# Patient Record
Sex: Female | Born: 1955 | Race: White | Hispanic: No | Marital: Married | Smoking: Never smoker
Health system: Southern US, Community
[De-identification: ages and names within clinical notes are randomized; demographics above are authoritative.]

## PROBLEM LIST (undated history)

## (undated) DIAGNOSIS — E039 Hypothyroidism, unspecified: Secondary | ICD-10-CM

## (undated) DIAGNOSIS — H269 Unspecified cataract: Secondary | ICD-10-CM

## (undated) DIAGNOSIS — M1990 Unspecified osteoarthritis, unspecified site: Secondary | ICD-10-CM

## (undated) DIAGNOSIS — M069 Rheumatoid arthritis, unspecified: Secondary | ICD-10-CM

## (undated) DIAGNOSIS — E785 Hyperlipidemia, unspecified: Secondary | ICD-10-CM

## (undated) DIAGNOSIS — F419 Anxiety disorder, unspecified: Secondary | ICD-10-CM

## (undated) DIAGNOSIS — K219 Gastro-esophageal reflux disease without esophagitis: Secondary | ICD-10-CM

## (undated) DIAGNOSIS — F32A Depression, unspecified: Secondary | ICD-10-CM

## (undated) DIAGNOSIS — G4730 Sleep apnea, unspecified: Secondary | ICD-10-CM

## (undated) DIAGNOSIS — I2510 Atherosclerotic heart disease of native coronary artery without angina pectoris: Secondary | ICD-10-CM

## (undated) DIAGNOSIS — C55 Malignant neoplasm of uterus, part unspecified: Secondary | ICD-10-CM

## (undated) DIAGNOSIS — I10 Essential (primary) hypertension: Secondary | ICD-10-CM

## (undated) DIAGNOSIS — I209 Angina pectoris, unspecified: Secondary | ICD-10-CM

## (undated) DIAGNOSIS — I499 Cardiac arrhythmia, unspecified: Secondary | ICD-10-CM

## (undated) DIAGNOSIS — J189 Pneumonia, unspecified organism: Secondary | ICD-10-CM

## (undated) DIAGNOSIS — F329 Major depressive disorder, single episode, unspecified: Secondary | ICD-10-CM

## (undated) DIAGNOSIS — J479 Bronchiectasis, uncomplicated: Secondary | ICD-10-CM

## (undated) DIAGNOSIS — D649 Anemia, unspecified: Secondary | ICD-10-CM

## (undated) DIAGNOSIS — H9190 Unspecified hearing loss, unspecified ear: Secondary | ICD-10-CM

## (undated) HISTORY — PX: ABDOMINAL HYSTERECTOMY: SHX81

## (undated) HISTORY — PX: COLONOSCOPY: SHX174

## (undated) HISTORY — DX: Unspecified osteoarthritis, unspecified site: M19.90

## (undated) HISTORY — DX: Atherosclerotic heart disease of native coronary artery without angina pectoris: I25.10

## (undated) HISTORY — DX: Hypothyroidism, unspecified: E03.9

## (undated) HISTORY — PX: TONSILLECTOMY AND ADENOIDECTOMY: SUR1326

## (undated) HISTORY — DX: Hyperlipidemia, unspecified: E78.5

## (undated) HISTORY — PX: CARDIAC CATHETERIZATION: SHX172

## (undated) HISTORY — PX: UPPER GASTROINTESTINAL ENDOSCOPY: SHX188

## (undated) HISTORY — PX: NO PAST SURGERIES: SHX2092

## (undated) HISTORY — DX: Anemia, unspecified: D64.9

## (undated) HISTORY — DX: Essential (primary) hypertension: I10

## (undated) HISTORY — PX: OTHER SURGICAL HISTORY: SHX169

## (undated) HISTORY — PX: BACK SURGERY: SHX140

## (undated) HISTORY — PX: MRI: SHX5353

## (undated) HISTORY — DX: Malignant neoplasm of uterus, part unspecified: C55

---
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
## 2004-04-23 DIAGNOSIS — I2510 Atherosclerotic heart disease of native coronary artery without angina pectoris: Secondary | ICD-10-CM

## 2004-04-23 HISTORY — PX: CORONARY STENT INTERVENTION: CATH118234

## 2004-04-23 HISTORY — PX: LEFT HEART CATH AND CORONARY ANGIOGRAPHY: CATH118249

## 2004-04-23 HISTORY — DX: Atherosclerotic heart disease of native coronary artery without angina pectoris: I25.10

## 2006-04-23 HISTORY — PX: TOTAL ABDOMINAL HYSTERECTOMY W/ BILATERAL SALPINGOOPHORECTOMY: SHX83

## 2009-06-10 ENCOUNTER — Encounter: Payer: Self-pay | Admitting: Cardiovascular Disease

## 2009-07-01 ENCOUNTER — Encounter: Payer: Self-pay | Admitting: Cardiovascular Disease

## 2010-01-23 ENCOUNTER — Encounter: Payer: Self-pay | Admitting: Cardiovascular Disease

## 2010-02-17 ENCOUNTER — Ambulatory Visit: Payer: Self-pay | Admitting: Cardiovascular Disease

## 2010-02-17 ENCOUNTER — Encounter: Payer: Self-pay | Admitting: Cardiovascular Disease

## 2010-02-17 DIAGNOSIS — I10 Essential (primary) hypertension: Secondary | ICD-10-CM | POA: Insufficient documentation

## 2010-02-17 DIAGNOSIS — E785 Hyperlipidemia, unspecified: Secondary | ICD-10-CM | POA: Insufficient documentation

## 2010-02-17 DIAGNOSIS — I2510 Atherosclerotic heart disease of native coronary artery without angina pectoris: Secondary | ICD-10-CM

## 2010-05-23 NOTE — Assessment & Plan Note (Signed)
Summary: NP6/CAD   Visit Type:  Initial Consult Primary Provider:  Dr Selena Batten  CC:  New evaluation per Dr. Selena Batten- CAD.  History of Present Illness: 55 year-old woman presents for initial evaluation of coronary artery disease.  In 2006 she developed leg swelling and shortness of breath. She noted progressive exercise intolerance secondary to chest tightness and dyspnea and ultimately was diagnosed with severe diagonal stenosis treated with PCI. She had immediate symptom relief after her coronary intervention.  Walks 2 miles per day without exertional symptoms. She specifically denies chest pain or tightness. No dyspnea, edema, orthopnea, or PND. She notes palpitations and increased heart rate without Toprol.    Current Medications (verified): 1)  Levothyroxine Sodium 100 Mcg Tabs (Levothyroxine Sodium) .... Take 1 Tablet By Mouth Once A Day 2)  Aspirin Ec 325 Mg Tbec (Aspirin) .... Take One Tablet By Mouth Daily 3)  Lipitor 80 Mg Tabs (Atorvastatin Calcium) .... Take One Tablet By Mouth Daily. 4)  Lisinopril 20 Mg Tabs (Lisinopril) .... Take One Tablet By Mouth Daily 5)  Toprol Xl 50 Mg Xr24h-Tab (Metoprolol Succinate) .... Take 1 Tablet By Mouth Once A Day 6)  Zetia 10 Mg Tabs (Ezetimibe) .... Take One Tablet By Mouth Daily.  Allergies (verified): No Known Drug Allergies  Past History:  Past medical history reviewed for relevance to current acute and chronic problems.  Past Medical History: CAD s/p PCI in 2006, treated with a bare metal stent (3.0x12 mm Driver placed in the diagonal branch) Hypertension Dyslipidemia Osteoarthritis - shoulder and hip Hypothyroidism  Past Surgical History: TAH-BSO 2008  Family History: Mother - diabetes and MI at 47, died at 59 Maternal grandmother died at 47 of cardiac disease  Social History: Works as a Charity fundraiser Recently moved to [place] from [place] Nonsmoker Social Etoh  Review of Systems       Positive for right shoulder and  left hip pain. Otherwise negative except as per HPI.   Vital Signs:  Patient profile:   55 year old female Height:      65 inches Weight:      162.75 pounds BMI:     27.18 Pulse rate:   59 / minute Resp:     18 per minute BP sitting:   120 / 64  (left arm) Cuff size:   large  Vitals Entered By: Vikki Ports (February 17, 2010 12:08 PM)  Physical Exam  General:  Pt is well-developed, alert and oriented, no acute distress HEENT: normal Neck: no thyromegaly           JVP normal, carotid upstrokes normal without bruits Lungs: CTA Chest: equal expansion  CV: Apical impulse nondisplaced, RRR without murmur or gallop Abd: soft, NT, positive BS, no HSM, no bruit Back: no CVA tenderness Ext: no clubbing, cyanosis, or edema        femoral pulses 2+ without bruits        pedal pulses 2+ and equal Skin: warm, dry, no rash Neuro: CNII-XII intact,strength 5/5 = b/l    EKG  Procedure date:  02/17/2010  Findings:      NSR 59 bpm within normal limits  Impression & Recommendations:  Problem # 1:  CORONARY ATHEROSCLEROSIS NATIVE CORONARY ARTERY (ICD-414.01) The patient's history was reviewed and it appears she was treated with single vessel bare metal stenting for Class 3 angina in 2006. She is going to obtain records from her cardiologist in [place] - she was given release of information forms today. She is  on an appropriate medical regimen and is having no angina with daily exercise. She has had followup stress testing and this has been normal by her report. Will plan on followup in 12 months, likely with a stress test at that time.  Her updated medication list for this problem includes:    Aspirin Ec 325 Mg Tbec (Aspirin) .Marland Kitchen... Take one tablet by mouth daily    Lisinopril 20 Mg Tabs (Lisinopril) .Marland Kitchen... Take one tablet by mouth daily    Toprol Xl 50 Mg Xr24h-tab (Metoprolol succinate) .Marland Kitchen... Take 1 tablet by mouth once a day  Orders: EKG w/ Interpretation (93000)  Problem # 2:   HYPERLIPIDEMIA-MIXED (ICD-272.4) Lipids from Dr Elmyra Ricks office reviewed from 01/23/2010: Total chol 152, Trig 114, HDL 53, and LDL 76. At goal.  Her updated medication list for this problem includes:    Lipitor 80 Mg Tabs (Atorvastatin calcium) .Marland Kitchen... Take one tablet by mouth daily.    Zetia 10 Mg Tabs (Ezetimibe) .Marland Kitchen... Take one tablet by mouth daily.  Problem # 3:  HYPERTENSION, BENIGN (ICD-401.1) Excellent control.  Her updated medication list for this problem includes:    Aspirin Ec 325 Mg Tbec (Aspirin) .Marland Kitchen... Take one tablet by mouth daily    Lisinopril 20 Mg Tabs (Lisinopril) .Marland Kitchen... Take one tablet by mouth daily    Toprol Xl 50 Mg Xr24h-tab (Metoprolol succinate) .Marland Kitchen... Take 1 tablet by mouth once a day  BP today: 120/64  Patient Instructions: 1)  Your physician recommends that you continue on your current medications as directed. Please refer to the Current Medication list given to you today. 2)  Your physician wants you to follow-up in: 1 YEAR.  You will receive a reminder letter in the mail two months in advance. If you don't receive a letter, please call our office to schedule the follow-up appointment.

## 2010-05-23 NOTE — Letter (Signed)
Summary: [facility] History and Physical Report    [facility] History and Physical Report    Imported By: Roderic Ovens 03/14/2010 14:40:01  _____________________________________________________________________  External Attachment:    Type:   Image     Comment:   External Document

## 2010-05-23 NOTE — Letter (Signed)
Summary: [facility] History and Physical Report    [facility] History and Physical Report    Imported By: Roderic Ovens 03/14/2010 14:39:34  _____________________________________________________________________  External Attachment:    Type:   Image     Comment:   External Document

## 2010-05-23 NOTE — Letter (Signed)
Summary: [facility] Medical Referral Scheduling   [facility] Referral Scheduling   Imported By: Roderic Ovens 03/14/2010 15:08:25  _____________________________________________________________________  External Attachment:    Type:   Image     Comment:   External Document

## 2010-11-21 ENCOUNTER — Telehealth: Payer: Self-pay | Admitting: Cardiovascular Disease

## 2010-11-21 NOTE — Telephone Encounter (Signed)
Spoke with pt who reports she has been having tightness in chest and abdominal distress off and on for last month.  Pt reports this is similar to symptoms she had prior to stent. Pt has been bothered by heat so has been taking it easy lately but notices she tires easily. Appointment scheduled for pt to see Tereso Newcomer, PA on November 24, 2010 at 8:30. I told her if symptoms were to worsen before then she should go to ED to be evaluated.

## 2010-11-21 NOTE — Telephone Encounter (Signed)
Pt called with tightness in center of chest with stomach distress/no swelling/ Having several weeks to one month.  Would like some test ordered for her.  Please call her at [phone]

## 2010-11-22 HISTORY — PX: LEFT HEART CATH AND CORONARY ANGIOGRAPHY: CATH118249

## 2010-11-23 ENCOUNTER — Encounter: Payer: Self-pay | Admitting: Physician Assistant

## 2010-11-24 ENCOUNTER — Telehealth: Payer: Self-pay | Admitting: Physician Assistant

## 2010-11-24 ENCOUNTER — Encounter: Payer: Self-pay | Admitting: Physician Assistant

## 2010-11-24 ENCOUNTER — Encounter: Payer: Self-pay | Admitting: *Deleted

## 2010-11-24 ENCOUNTER — Ambulatory Visit (INDEPENDENT_AMBULATORY_CARE_PROVIDER_SITE_OTHER): Payer: Self-pay | Admitting: Physician Assistant

## 2010-11-24 DIAGNOSIS — I2510 Atherosclerotic heart disease of native coronary artery without angina pectoris: Secondary | ICD-10-CM

## 2010-11-24 DIAGNOSIS — R079 Chest pain, unspecified: Secondary | ICD-10-CM

## 2010-11-24 DIAGNOSIS — E785 Hyperlipidemia, unspecified: Secondary | ICD-10-CM

## 2010-11-24 DIAGNOSIS — I10 Essential (primary) hypertension: Secondary | ICD-10-CM

## 2010-11-24 LAB — CBC WITH DIFFERENTIAL/PLATELET
Basophils Relative: 0.6 % (ref 0.0–3.0)
Eosinophils Absolute: 0.1 10*3/uL (ref 0.0–0.7)
Eosinophils Relative: 1.9 % (ref 0.0–5.0)
HCT: 38.3 % (ref 36.0–46.0)
Hemoglobin: 12.8 g/dL (ref 12.0–15.0)
MCHC: 33.5 g/dL (ref 30.0–36.0)
MCV: 91.6 fl (ref 78.0–100.0)
Monocytes Absolute: 0.4 10*3/uL (ref 0.1–1.0)
Neutro Abs: 4.6 10*3/uL (ref 1.4–7.7)
RBC: 4.18 Mil/uL (ref 3.87–5.11)
WBC: 6.8 10*3/uL (ref 4.5–10.5)

## 2010-11-24 LAB — PROTIME-INR: INR: 1 ratio (ref 0.8–1.0)

## 2010-11-24 LAB — BASIC METABOLIC PANEL
CO2: 27 mEq/L (ref 19–32)
Calcium: 9.3 mg/dL (ref 8.4–10.5)
Chloride: 108 mEq/L (ref 96–112)
Creatinine, Ser: 0.7 mg/dL (ref 0.4–1.2)
Glucose, Bld: 99 mg/dL (ref 70–99)

## 2010-11-24 MED ORDER — ISOSORBIDE MONONITRATE ER 30 MG PO TB24: ORAL_TABLET | ORAL | Status: DC

## 2010-11-24 MED ORDER — NITROGLYCERIN 0.4 MG SL SUBL: 0.4000 mg | SUBLINGUAL_TABLET | SUBLINGUAL | Status: DC | PRN

## 2010-11-24 NOTE — Patient Instructions (Signed)
Your physician has requested that you have a cardiac catheterization. Cardiac catheterization is used to diagnose and/or treat various heart conditions. Doctors may recommend this procedure for a number of different reasons. The most common reason is to evaluate chest pain. Chest pain can be a symptom of coronary artery disease (CAD), and cardiac catheterization can show whether plaque is narrowing or blocking your heart's arteries. This procedure is also used to evaluate the valves, as well as measure the blood flow and oxygen levels in different parts of your heart. For further information please visit [email or web address] Please follow instruction sheet, as given. Scheduled for November 27, 2010.  Your physician has recommended you make the following change in your medication:  Start Imdur 15 mg by mouth daily. Use SL NTG as needed for chest pain.

## 2010-11-24 NOTE — Progress Notes (Signed)
History of Present Illness: Primary Cardiologist: Cindy [name] is a 55 y.o. female presents for evaluation of chest pain.  She established with Dr. Excell Seltzer in 01/2010 for CAD.  In 2006 she developed leg swelling and shortness of breath. She noted progressive exercise intolerance secondary to chest tightness and dyspnea and ultimately was diagnosed with severe diagonal stenosis treated with PCI. She had immediate symptom relief after her coronary intervention.  This was performed in [place].    She notes epigastric and lower chest discomfort over the last 6-8 weeks.  This is similar to her previous angina.  It feels heavy.  It is fairly constant.  She's nauseated most of the time.  There is no association with meals.  She denies dysphagia.  She does have some slight dyspnea with exertion.  She has radiation to her back when she exerts herself.  She denies any radiation to her jaw or arms.  She denies syncope.  She's had lightheadedness.  She denies orthopnea, PND.  She has not noted any edema.  Of note, she underwent stress testing prior to her cardiac catheterization in 2006.  Her symptoms currently are not as severe as they were in 2006  Past Medical History  Diagnosis Date  . Coronary artery disease     s/p PCI in 2006, treated with bare metal stent (3.0x22mm Driver placed in diagonal branch)  . Hypertension   . Dyslipidemia   . Osteoarthritis     Shoulder and hip  . Hypothyroidism     Current Outpatient Prescriptions  Medication Sig Dispense Refill  . aspirin EC 325 MG tablet Take 325 mg by mouth daily.        Marland Kitchen atorvastatin (LIPITOR) 80 MG tablet Take 80 mg by mouth daily.        Marland Kitchen ezetimibe (ZETIA) 10 MG tablet Take 10 mg by mouth daily.        Marland Kitchen levothyroxine (SYNTHROID, LEVOTHROID) 100 MCG tablet Take 100 mcg by mouth daily.        Marland Kitchen lisinopril (PRINIVIL,ZESTRIL) 20 MG tablet Take 20 mg by mouth daily.        . metoprolol (TOPROL-XL) 50 MG 24 hr tablet Take 50 mg  by mouth daily.          Allergies: No Known Allergies  Social history:  Nonsmoker  Family history:  Significant for premature CAD in her mother  ROS:  Please see the history of present illness.  She reports a nonproductive cough.  All other systems reviewed and negative.   Vital Signs: Ht 5\' 5"  (1.651 m)  Wt 162 lb (73.483 kg)  BMI 26.96 kg/m2  PHYSICAL EXAM: Well nourished, well developed, in no acute distress HEENT: normal Neck: no JVD Endocrine: No thyromegaly Vascular: No carotid bruits Cardiac:  normal S1, S2; RRR; no murmur Lungs:  clear to auscultation bilaterally, no wheezing, rhonchi or rales Abd: soft, nontender, no hepatomegaly Ext: no edema Skin: warm and dry Neuro:  CNs 2-12 intact, no focal abnormalities noted Psych: Normal affect  EKG:  Sinus bradycardia, rate 56, normal axis, nonspecific ST-T changes, no significant change compared to prior tracings.  ASSESSMENT AND PLAN:

## 2010-11-24 NOTE — Assessment & Plan Note (Signed)
Controlled.  Continue current therapy.  

## 2010-11-24 NOTE — Assessment & Plan Note (Signed)
Her symptoms are very similar to her previous angina.  Currently, her symptoms are stable.  Her EKG is unchanged.  She has not had an assessment since her PCI in 2006.  I recommend proceeding with cardiac catheterization.  I discussed this with Dr. Myrtis Ser, the DOD.  He also agrees.  I will place the patient on isosorbide 15 mg a day.  She will also be given a prescription for p.r.n. Nitroglycerin.  Cardiac catheterization will be arranged early next week.  If her symptoms should change or worsen over the next several days, she knows to go to emergency room.  Risks and benefits of cardiac catheterization have been discussed with the patient.  These include bleeding, infection, kidney damage, stroke, heart attack, death.  The patient understands these risks and is willing to proceed.

## 2010-11-24 NOTE — Telephone Encounter (Addendum)
Due to [facility] being closed for lunch, I left a message with the operator requesting the cath and intravenous records for this patient asap.

## 2010-11-24 NOTE — Assessment & Plan Note (Signed)
Continue ASA and statin.  Proceed with cardiac cath as noted.

## 2010-11-24 NOTE — Assessment & Plan Note (Addendum)
Managed by PCP

## 2010-11-24 NOTE — Telephone Encounter (Signed)
Faxed ROI (provided by [facility]) to Dr. Lowella Fairy at [facility] in [place], [place] ([phone])

## 2010-11-27 ENCOUNTER — Inpatient Hospital Stay (HOSPITAL_BASED_OUTPATIENT_CLINIC_OR_DEPARTMENT_OTHER)
Admission: RE | Admit: 2010-11-27 | Discharge: 2010-11-27 | Disposition: A | Discharge status: Home / Self Care | Payer: Managed Care, Other (non HMO) | Source: Ambulatory Visit | Attending: Cardiovascular Disease | Admitting: Cardiovascular Disease

## 2010-11-27 DIAGNOSIS — R079 Chest pain, unspecified: Secondary | ICD-10-CM | POA: Insufficient documentation

## 2010-11-27 DIAGNOSIS — I2510 Atherosclerotic heart disease of native coronary artery without angina pectoris: Secondary | ICD-10-CM | POA: Insufficient documentation

## 2010-11-27 DIAGNOSIS — E039 Hypothyroidism, unspecified: Secondary | ICD-10-CM | POA: Insufficient documentation

## 2010-11-27 DIAGNOSIS — Z9861 Coronary angioplasty status: Secondary | ICD-10-CM | POA: Insufficient documentation

## 2010-11-27 DIAGNOSIS — E785 Hyperlipidemia, unspecified: Secondary | ICD-10-CM | POA: Insufficient documentation

## 2010-11-27 DIAGNOSIS — I10 Essential (primary) hypertension: Secondary | ICD-10-CM | POA: Insufficient documentation

## 2010-11-28 NOTE — Cardiovascular Report (Signed)
NAME:  [name], Cindy                ACCOUNT NO.:  [id number]  MEDICAL RECORD NO.:  [id number]  LOCATION:                                 FACILITY:  PHYSICIAN:  Verne Carrow, MDDATE OF BIRTH:  October 02, 1955  DATE OF PROCEDURE:  11/27/2010 DATE OF DISCHARGE:                           CARDIAC CATHETERIZATION   PRIMARY CARDIOLOGIST:  Veverly Fells. Excell Seltzer, MD  PROCEDURES PERFORMED: 1. Left heart catheterization. 2. Selective coronary angiography. 3. Left ventricular angiogram.  OPERATOR:  Verne Carrow, MD  INDICATIONS:  This is a 55 year old Caucasian female with a history of hypertension, hyperlipidemia, hypothyroidism, and coronary artery disease who underwent placement of a bare-metal stent in the diagonal branch of her left anterior descending artery in 2006 in [place]. The patient has recently had progressive exercise intolerance with chest discomfort and shortness of breath.  She also has some nausea.  She was seen in the office by Tereso Newcomer and recommendations were made for diagnostic cardiac catheterization today.  DETAILS OF PROCEDURE IN DETAIL:  The patient was brought to the Outpatient Cardiac Catheterization Laboratory after signing informed consent for the procedure.  The right groin was prepped and draped in a sterile fashion.  1% lidocaine was used for local anesthesia.  A 4- French sheath was inserted into the right femoral artery without difficulty.  Standard diagnostic catheters were used to perform selective coronary angiography.  A pigtail catheter was used to perform left ventricular angiogram.  The patient tolerated the diagnostic portion of the procedure well.  She was taken to the recovery area in stable condition.  HEMODYNAMIC FINDINGS:  Central aortic pressure 133/70.  Left ventricular pressure 133/13/21.  ANGIOGRAPHIC FINDINGS: 1. Left main coronary artery had no evidence of disease. 2. Left anterior descending was a large vessel  that coursed to the     apex and gave off a moderate-sized diagonal branch.  The left     anterior descending artery had mild luminal irregularities in the     proximal mid segment, but no focally obstructive lesions.  The     diagonal branch was a moderate-sized vessel with a patent stent in     the midportion of this vessel.  There appeared to be approximately     20% diffuse in-stent restenosis, but this did not appear to be flow     limiting. 3. The circumflex artery gave rise to an early obtuse marginal branch     followed by a moderate-sized second obtuse marginal branch.  All     these vessels were patent, but no obstructive disease. 4. The right coronary artery is a large dominant vessel with no     evidence of disease. 5. Left ventricular angiogram was performed in the RAO projection and     showed normal left ventricular systolic function with ejection     fraction of 65%.  IMPRESSION: 1. Stable single-vessel coronary artery disease. 2. Normal left ventricular function.  RECOMMENDATIONS:  I would recommend continued medical management at this time.  The patient has been started on Imdur in the office and has felt better since she started this medication.  There is a possibility that she does have small vessel  disease with a good response to the Imdur. She will follow up with Dr. Excell Seltzer in several weeks.     Verne Carrow, MD     CM/MEDQ  D:  11/27/2010  T:  11/27/2010  Job:  [id number]  cc:   Tereso Newcomer, PA-C  Electronically Signed by Verne Carrow MD on 11/28/2010 01:38:59 PM

## 2010-12-14 ENCOUNTER — Encounter: Payer: Managed Care, Other (non HMO) | Admitting: Physician Assistant

## 2010-12-19 ENCOUNTER — Ambulatory Visit (INDEPENDENT_AMBULATORY_CARE_PROVIDER_SITE_OTHER): Payer: Managed Care, Other (non HMO) | Admitting: Physician Assistant

## 2010-12-19 ENCOUNTER — Encounter: Payer: Self-pay | Admitting: Physician Assistant

## 2010-12-19 VITALS — BP 118/64 | HR 71 | Resp 12 | Ht 65.0 in | Wt 164.0 lb

## 2010-12-19 DIAGNOSIS — R079 Chest pain, unspecified: Secondary | ICD-10-CM

## 2010-12-19 DIAGNOSIS — I2510 Atherosclerotic heart disease of native coronary artery without angina pectoris: Secondary | ICD-10-CM

## 2010-12-19 DIAGNOSIS — I10 Essential (primary) hypertension: Secondary | ICD-10-CM

## 2010-12-19 DIAGNOSIS — E785 Hyperlipidemia, unspecified: Secondary | ICD-10-CM

## 2010-12-19 MED ORDER — ISOSORBIDE MONONITRATE ER 30 MG PO TB24: ORAL_TABLET | ORAL | Status: DC

## 2010-12-19 NOTE — Assessment & Plan Note (Signed)
As above.  Continue ASA and statin.  Follow up with Dr. Excell Seltzer in 6 weeks.

## 2010-12-19 NOTE — Assessment & Plan Note (Signed)
Controlled.  Continue current therapy.  

## 2010-12-19 NOTE — Progress Notes (Signed)
History of Present Illness: Primary Cardiologist: [name] is a 55 y.o. female who presents for post hospital follow up.  She established with Dr. Excell [name] in 01/2010 for CAD.  In 2006 she developed leg swelling and shortness of breath. She noted progressive exercise intolerance secondary to chest tightness and dyspnea and ultimately was diagnosed with severe diagonal stenosis treated with PCI.  She had immediate symptom relief after her coronary intervention.   This was performed in [place].   I saw her a few weeks ago with epigastric and lower chest discomfort that was similar to her previous angina.  I arranged for her to have a cardiac catheterization.  This was performed 8/6 by Dr. Clifton [name].  It demonstrated a patent stent in the mid diagonal with 20% in-stent restenosis, otherwise no CAD and an EF of 65%.  She noted improvement with initiation of isosorbide.  This was continued post catheterization due the possibility that she may have small vessel disease.  She has only been taking the Imdur PRN.  She will get recurrent symptoms every several days.  She notes associated dyspnea.  She may have some association with meals.  No dysphagia, melena or weight loss.  Symptoms generally come on with exertion.  No syncope.  No orthopnea, PND or edema.    Past Medical History  Diagnosis Date  . Coronary artery disease     s/p PCI in 2006, treated with bare metal stent (3.0x39mm Driver placed in diagonal branch);  cath 8/12: mDx stent ok with 20% ISR, EF 65%  . Hypertension   . Dyslipidemia   . Osteoarthritis     Shoulder and hip  . Hypothyroidism     Current Outpatient Prescriptions  Medication Sig Dispense Refill  . aspirin EC 325 MG tablet Take 325 mg by mouth daily.        Marland Kitchen atorvastatin (LIPITOR) 80 MG tablet Take 80 mg by mouth daily.        Marland Kitchen ezetimibe (ZETIA) 10 MG tablet Take 10 mg by mouth daily.        . isosorbide mononitrate (IMDUR) 30 MG 24 hr tablet Take half  tablet by mouth daily  15 tablet  11  . levothyroxine (SYNTHROID, LEVOTHROID) 100 MCG tablet Take 100 mcg by mouth daily.        Marland Kitchen lisinopril (PRINIVIL,ZESTRIL) 20 MG tablet Take 20 mg by mouth daily.        . metoprolol (TOPROL-XL) 50 MG 24 hr tablet Take 50 mg by mouth daily.        . nitroGLYCERIN (NITROSTAT) 0.4 MG SL tablet Place 1 tablet (0.4 mg total) under the tongue every 5 (five) minutes as needed for chest pain.  25 tablet  6  . DISCONTD: isosorbide mononitrate (IMDUR) 30 MG 24 hr tablet Take half tablet by mouth daily  15 tablet  11    Allergies: No Known Allergies  Social history:  Nonsmoker  Vital Signs: BP 118/64  Pulse 71  Resp 12  Ht 5\' 5"  (1.651 m)  Wt 164 lb (74.39 kg)  BMI 27.29 kg/m2  PHYSICAL EXAM: Well nourished, well developed, in no acute distress HEENT: normal Neck: no JVD Cardiac:  normal S1, S2; RRR; no murmur Lungs:  clear to auscultation bilaterally, no wheezing, rhonchi or rales Abd: soft, nontender, no hepatomegaly Ext: no edema; RFA site without hematoma or bruit Skin: warm and dry Neuro:  CNs 2-12 intact, no focal abnormalities noted Psych: Normal affect  EKG:  Normal sinus rhythm, heart rate 64, normal axis, poor R-wave progression, nonspecific ST-T wave changes  ASSESSMENT AND PLAN:

## 2010-12-19 NOTE — Assessment & Plan Note (Signed)
Managed by PCP.  Goal LDL less than 70.  

## 2010-12-19 NOTE — Patient Instructions (Signed)
Your physician recommends that you schedule a follow-up appointment in: 01/30/11 @ 2:15 to see Dr. Excell Seltzer  Your physician has recommended you make the following change in your medication: START TAKING YOUR IMDUR 30 MG TABLET 1/2 (HALF) TABLET EVERY DAY AS PER SCOTT WEAVER, PA-C.

## 2010-12-19 NOTE — Assessment & Plan Note (Signed)
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  I have asked her to take her Imdur every day to prevent symptoms.  I have also asked her to increase her activity.  If she has further symptoms, she may need to try taking a PPI and seeing GI.  However, at this point, I do not suspect GI etiology.

## 2011-01-30 ENCOUNTER — Ambulatory Visit (INDEPENDENT_AMBULATORY_CARE_PROVIDER_SITE_OTHER): Payer: Managed Care, Other (non HMO) | Admitting: Cardiovascular Disease

## 2011-01-30 ENCOUNTER — Encounter: Payer: Self-pay | Admitting: Cardiovascular Disease

## 2011-01-30 DIAGNOSIS — I2510 Atherosclerotic heart disease of native coronary artery without angina pectoris: Secondary | ICD-10-CM

## 2011-01-30 DIAGNOSIS — I10 Essential (primary) hypertension: Secondary | ICD-10-CM

## 2011-01-30 MED ORDER — EZETIMIBE 10 MG PO TABS: 10.0000 mg | ORAL_TABLET | Freq: Every day | ORAL | Status: AC

## 2011-01-30 MED ORDER — ISOSORBIDE MONONITRATE ER 30 MG PO TB24: ORAL_TABLET | ORAL | Status: DC

## 2011-01-30 MED ORDER — LISINOPRIL 20 MG PO TABS: 20.0000 mg | ORAL_TABLET | Freq: Every day | ORAL | Status: DC

## 2011-01-30 MED ORDER — METOPROLOL SUCCINATE ER 50 MG PO TB24: 50.0000 mg | ORAL_TABLET | Freq: Every day | ORAL | Status: DC

## 2011-01-30 MED ORDER — ATORVASTATIN CALCIUM 80 MG PO TABS
80.0000 mg | ORAL_TABLET | Freq: Every day | ORAL | Status: DC
Start: 2011-01-30 — End: 2015-02-22

## 2011-01-30 NOTE — Patient Instructions (Signed)
Your physician recommends that you continue on your current medications as directed. Please refer to the Current Medication list given to you today.   Your physician wants you to follow-up in:1 YEAR  You will receive a reminder letter in the mail two months in advance. If you don't receive a letter, please call our office to schedule the follow-up appointment.  

## 2011-01-30 NOTE — Progress Notes (Signed)
HPI:  This is a 55 year old woman presenting for followup. She has a history of coronary artery disease and underwent stenting of the diagonal branch back in 2006. Bare-metal stent platform was utilized. She presented with recurrent chest pain in August 2012 and underwent diagnostic catheterization by Dr. Clifton James. This demonstrated no significant stenosis of the left circumflex and right coronary arteries. There was minimal nonobstructive disease in the LAD with a patent stent in the diagonal with only mild in-stent restenosis. The patient's left ventricular function is normal.  She does have angina at times of high stress and with walking up hills. Her chest pain symptoms are improved after starting Imdur. She has minimal symptoms with her day-to-day routine. She denies exertional dyspnea, edema, or palpitations.  Outpatient Encounter Prescriptions as of 01/30/2011  Medication Sig Dispense Refill  . aspirin EC 325 MG tablet Take 325 mg by mouth daily.        Marland Kitchen atorvastatin (LIPITOR) 80 MG tablet Take 80 mg by mouth daily.        Marland Kitchen ezetimibe (ZETIA) 10 MG tablet Take 10 mg by mouth daily.        . isosorbide mononitrate (IMDUR) 30 MG 24 hr tablet Take half tablet by mouth daily  15 tablet  11  . levothyroxine (SYNTHROID, LEVOTHROID) 100 MCG tablet Take 100 mcg by mouth daily.        Marland Kitchen lisinopril (PRINIVIL,ZESTRIL) 20 MG tablet Take 20 mg by mouth daily.        . metoprolol (TOPROL-XL) 50 MG 24 hr tablet Take 50 mg by mouth daily.        . nitroGLYCERIN (NITROSTAT) 0.4 MG SL tablet Place 1 tablet (0.4 mg total) under the tongue every 5 (five) minutes as needed for chest pain.  25 tablet  6    No Known Allergies  Past Medical History  Diagnosis Date  . Coronary artery disease     s/p PCI in 2006, treated with bare metal stent (3.0x78mm Driver placed in diagonal branch);  cath 8/12: mDx stent ok with 20% ISR, EF 65%  . Hypertension   . Dyslipidemia   . Osteoarthritis     Shoulder and hip  .  Hypothyroidism     ROS: Negative except as per HPI  BP 130/68  Pulse 64  Resp 18  Ht 5\' 5"  (1.651 m)  Wt 161 lb 12.8 oz (73.392 kg)  BMI 26.92 kg/m2  PHYSICAL EXAM: Pt is alert and oriented, NAD HEENT: normal Neck: JVP - normal, carotids 2+= without bruits Lungs: CTA bilaterally CV: RRR without murmur or gallop Abd: soft, NT Ext: no C/C/E, distal pulses intact and equal Skin: warm/dry no rash  ASSESSMENT AND PLAN:

## 2011-02-04 ENCOUNTER — Encounter: Payer: Self-pay | Admitting: Cardiovascular Disease

## 2011-02-04 NOTE — Assessment & Plan Note (Signed)
Blood pressure is controlled on current agents.

## 2011-02-04 NOTE — Assessment & Plan Note (Signed)
The patient is stable with class 1-2 angina. She has done well with the addition of Imdur. Her recent catheterization results were reviewed and they are reassuring. She should continue with her current risk reduction measures which include aspirin, lipid-lowering therapy, and ACE inhibitor and beta blocker, and regular exercise.

## 2012-01-30 ENCOUNTER — Ambulatory Visit: Payer: Managed Care, Other (non HMO) | Admitting: Cardiovascular Disease

## 2012-02-14 ENCOUNTER — Ambulatory Visit: Payer: Managed Care, Other (non HMO) | Admitting: Cardiovascular Disease

## 2012-03-14 ENCOUNTER — Encounter: Payer: Self-pay | Admitting: Cardiovascular Disease

## 2012-03-28 ENCOUNTER — Ambulatory Visit (INDEPENDENT_AMBULATORY_CARE_PROVIDER_SITE_OTHER): Payer: BC Managed Care – PPO | Admitting: Cardiovascular Disease

## 2012-03-28 ENCOUNTER — Encounter: Payer: Self-pay | Admitting: Cardiovascular Disease

## 2012-03-28 VITALS — BP 122/78 | HR 62 | Ht 66.0 in | Wt 160.0 lb

## 2012-03-28 DIAGNOSIS — I2510 Atherosclerotic heart disease of native coronary artery without angina pectoris: Secondary | ICD-10-CM

## 2012-03-28 MED ORDER — RANOLAZINE ER 500 MG PO TB12: 500.0000 mg | ORAL_TABLET | Freq: Two times a day (BID) | ORAL | Status: DC

## 2012-03-28 NOTE — Progress Notes (Signed)
HPI:  56 year old woman presenting for followup evaluation. The patient has a history of coronary artery disease and underwent stenting of a diagonal branch and 2006. She underwent cardiac catheterization most recently in August 2012 when she presented with chest pain. She was found to have minor nonobstructive disease with continued patency of her diagonal stent and normal left ventricular systolic function was noted.  The patient continues to have anginal symptoms with exertion. She denies resting symptoms, but does experience chest and epigastric discomfort with exertion and sometimes with emotional stress. This has responded well isosorbide but she does not like to take this every day. She only takes it as needed and has complete relief within one hour. She admits to associated shortness of breath. Her symptoms have not changed over quite some time. She denies edema, palpitations, orthopnea, or PND.  Outpatient Encounter Prescriptions as of 03/28/2012  Medication Sig Dispense Refill  . aspirin EC 325 MG tablet Take 325 mg by mouth daily.        Marland Kitchen atorvastatin (LIPITOR) 80 MG tablet Take 1 tablet (80 mg total) by mouth daily.  90 tablet  3  . ezetimibe (ZETIA) 10 MG tablet Take 1 tablet (10 mg total) by mouth daily.  90 tablet  3  . isosorbide mononitrate (IMDUR) 30 MG 24 hr tablet Take half tablet by mouth daily  90 tablet  3  . levothyroxine (SYNTHROID, LEVOTHROID) 100 MCG tablet Take 100 mcg by mouth daily.        Marland Kitchen lisinopril (PRINIVIL,ZESTRIL) 20 MG tablet Take 1 tablet (20 mg total) by mouth daily.  90 tablet  3  . metoprolol (TOPROL-XL) 50 MG 24 hr tablet Take 1 tablet (50 mg total) by mouth daily.  90 tablet  3  . nitroGLYCERIN (NITROSTAT) 0.4 MG SL tablet Place 1 tablet (0.4 mg total) under the tongue every 5 (five) minutes as needed for chest pain.  25 tablet  6    No Known Allergies  Past Medical History  Diagnosis Date  . Coronary artery disease     s/p PCI in 2006, treated  with bare metal stent (3.0x64mm Driver placed in diagonal branch);  cath 8/12: mDx stent ok with 20% ISR, EF 65%  . Hypertension   . Dyslipidemia   . Osteoarthritis     Shoulder and hip  . Hypothyroidism    ROS: Negative except as per HPI  BP 122/78  Pulse 62  Ht 5\' 6"  (1.676 m)  Wt 72.576 kg (160 lb)  BMI 25.82 kg/m2  SpO2 98%  PHYSICAL EXAM: Pt is alert and oriented, NAD HEENT: normal Neck: JVP - normal, carotids 2+= without bruits Lungs: CTA bilaterally CV: RRR without murmur or gallop Abd: soft, NT, Positive BS, no hepatomegaly Ext: no C/C/E, distal pulses intact and equal Skin: warm/dry no rash  EKG:  Normal sinus rhythm 62 beats per minute, within normal limits.  ASSESSMENT AND PLAN: 1. Coronary artery disease, native vessel with CCS class II angina. 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 We discussed potential treatment options including daily isosorbide versus a trial of Ranexa. She is willing to consider Ranexa and we have written her a prescription for 500 mg twice daily and also provided her with samples. She understands that if she has breakthrough angina she can still take isosorbide as needed. She otherwise will continue her current medical program.  2. Hypertension. Well controlled on current  medical therapy  3. Hyperlipidemia. She is managed by Dr. Selena Batten. She is on a combination of ezetimibe and atorvastatin.  Tonny Bollman 03/28/2012 5:11 PM

## 2012-03-28 NOTE — Patient Instructions (Addendum)
Your physician wants you to follow-up in:  12 months.  You will receive a reminder letter in the mail two months in advance. If you don't receive a letter, please call our office to schedule the follow-up appointment.  Your physician has recommended you make the following change in your medication: Start Ranexa 500 mg by mouth twice daily

## 2012-06-19 ENCOUNTER — Ambulatory Visit
Admission: RE | Admit: 2012-06-19 | Discharge: 2012-06-19 | Disposition: A | Discharge status: Home / Self Care | Payer: BC Managed Care – PPO | Source: Ambulatory Visit | Attending: Internal Medicine | Admitting: Internal Medicine

## 2012-06-19 ENCOUNTER — Other Ambulatory Visit: Payer: BC Managed Care – PPO

## 2012-06-19 ENCOUNTER — Other Ambulatory Visit: Payer: Self-pay | Admitting: Internal Medicine

## 2012-06-19 DIAGNOSIS — R519 Headache, unspecified: Secondary | ICD-10-CM

## 2012-10-22 ENCOUNTER — Encounter: Payer: Self-pay | Admitting: Gastroenterology

## 2012-10-30 ENCOUNTER — Encounter: Payer: Self-pay | Admitting: Gastroenterology

## 2012-10-30 ENCOUNTER — Ambulatory Visit (INDEPENDENT_AMBULATORY_CARE_PROVIDER_SITE_OTHER): Payer: BC Managed Care – PPO | Admitting: Gastroenterology

## 2012-10-30 VITALS — BP 104/60 | HR 64 | Ht 65.5 in | Wt 164.4 lb

## 2012-10-30 DIAGNOSIS — R1013 Epigastric pain: Secondary | ICD-10-CM

## 2012-10-30 DIAGNOSIS — Z1211 Encounter for screening for malignant neoplasm of colon: Secondary | ICD-10-CM

## 2012-10-30 MED ORDER — ESOMEPRAZOLE MAGNESIUM 40 MG PO CPDR: 40.0000 mg | DELAYED_RELEASE_CAPSULE | Freq: Two times a day (BID) | ORAL | Status: DC

## 2012-10-30 NOTE — Progress Notes (Addendum)
History of Present Illness: This is a 57 year old female who relates a 6 week history of constant epigastric pain exacerbated by meals. In addition she has had an intermittent cough. Zantac, TUMS, Gas-X and Prilosec have provided minimal relief. She states she previously underwent colonoscopy and upper endoscopy in [place] around 2006 for evaluation of anemia. She states her colonoscopy was normal in her upper endoscopy showed signs of acid reflux and she was prescribed Nexium although she did not recall having reflux symptoms and generally has not had any upper GI symptoms until recently. Denies weight loss, constipation, diarrhea, change in stool caliber, melena, hematochezia, nausea, vomiting, dysphagia, chest pain.  Review of Systems: Pertinent positive and negative review of systems were noted in the above HPI section. All other review of systems were otherwise negative.  Current Medications, Allergies, Past Medical History, Past Surgical History, Family History and Social History were reviewed in [employer] record.  Physical Exam: General: Well developed , well nourished, no acute distress Head: Normocephalic and atraumatic Eyes:  sclerae anicteric, EOMI Ears: Normal auditory acuity Mouth: No deformity or lesions Neck: Supple, no masses or thyromegaly Lungs: Clear throughout to auscultation Heart: Regular rate and rhythm; no murmurs, rubs or bruits Abdomen: Soft, non tender and non distended. No masses, hepatosplenomegaly or hernias noted. Normal Bowel sounds Musculoskeletal: Symmetrical with no gross deformities  Skin: No lesions on visible extremities Pulses:  Normal pulses noted Extremities: No clubbing, cyanosis, edema or deformities noted Neurological: Alert oriented x 4, grossly nonfocal Cervical Nodes:  No significant cervical adenopathy Inguinal Nodes: No significant inguinal adenopathy Psychological:  Alert and cooperative. Normal mood and  affect  Assessment and Recommendations:  1. Epigastric pain. Rule out GERD, esophagitis, ulcer, gastritis. DC omeprazole. Begin Nexium 40 mg twice a day and standard antireflux measures. Schedule upper endoscopy. The risks, benefits, and alternatives to endoscopy with possible biopsy and possible dilation were discussed with the patient and they consent to proceed.   2. Colorectal cancer screening, average risk. Screening colonoscopy in June 2016. Attempt to obtain prior colonoscopy and upper endoscopy records and will adjust colonoscopy screening date accordingly if we obtain records.   11/13/12 records from [place] received and reviewed. 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 EGD in 03/2005: erosive gastritis. Biopsy: chronic gastritis, H. pylori negative,

## 2012-10-30 NOTE — Patient Instructions (Addendum)
Start Nexium samples one tablet by mouth twice daily your Upper Endoscopy.   You have been scheduled for an endoscopy with propofol. Please follow written instructions given to you at your visit today. If you use inhalers (even only as needed), please bring them with you on the day of your procedure. Your physician has requested that you go to www.startemmi.com and enter the access code given to you at your visit today. This web site gives a general overview about your procedure. However, you should still follow specific instructions given to you by our office regarding your preparation for the procedure.  Patient advised to avoid spicy, acidic, citrus, chocolate, mints, fruit and fruit juices.  Limit the intake of caffeine, alcohol and Soda.  Don't exercise too soon after eating.  Don't lie down within 3-4 hours of eating.  Elevate the head of your bed.  Thank you for choosing me and [facility].  Venita Lick. Pleas Koch., MD., Clementeen Graham  cc: Pearson Grippe, MD

## 2012-11-06 ENCOUNTER — Telehealth: Payer: Self-pay | Admitting: Gastroenterology

## 2012-11-06 NOTE — Telephone Encounter (Signed)
Rec'd from [facility] forward 3 pages to Dr.Stark

## 2012-11-14 ENCOUNTER — Ambulatory Visit (AMBULATORY_SURGERY_CENTER): Payer: BC Managed Care – PPO | Admitting: Gastroenterology

## 2012-11-14 ENCOUNTER — Encounter: Payer: Self-pay | Admitting: Gastroenterology

## 2012-11-14 VITALS — BP 139/75 | HR 68 | Temp 98.8°F | Resp 19 | Ht 65.5 in | Wt 164.4 lb

## 2012-11-14 DIAGNOSIS — R1013 Epigastric pain: Secondary | ICD-10-CM

## 2012-11-14 MED ORDER — SODIUM CHLORIDE 0.9 % IV SOLN: 500.0000 mL | INTRAVENOUS | Status: DC

## 2012-11-14 NOTE — Op Note (Signed)
[facility] [address] [place], [address]   ENDOSCOPY PROCEDURE REPORT  PATIENT: Cindy [name], Cindy [name]  MR#: [id number] BIRTHDATE: 1956-04-09 , 57  yrs. old GENDER: Female ENDOSCOPIST: Meryl Dare, MD, [facility] REFERRED BY: PROCEDURE DATE:  11/14/2012 PROCEDURE:  EGD, diagnostic ASA CLASS:     Class II INDICATIONS:  Epigastric pain. MEDICATIONS: MAC sedation, administered by CRNA and propofol (Diprivan) 150mg  IV TOPICAL ANESTHETIC: Cetacaine Spray  DESCRIPTION OF PROCEDURE: After the risks benefits and alternatives of the procedure were thoroughly explained, informed consent was obtained.  The LB [id number] endoscope was introduced through the mouth and advanced to the second portion of the duodenum. Without limitations.  The instrument was slowly withdrawn as the mucosa was fully examined.    ESOPHAGUS: The mucosa of the esophagus appeared normal. STOMACH: The mucosa and folds of the stomach appeared normal. DUODENUM: The duodenal mucosa showed no abnormalities in the bulb and second portion of the duodenum.  Retroflexed views revealed no abnormalities.     The scope was then withdrawn from the patient and the procedure completed.  COMPLICATIONS: There were no complications.  ENDOSCOPIC IMPRESSION: 1.   The EGD appeared normal  RECOMMENDATIONS: 1.  Anti-reflux regimen 2.  Continue PPI 3.  Abdominal Ultrasound 4.  Office appt in 1 month   eSigned:  Meryl Dare, MD, [facility] 11/14/2012 3:45 PM

## 2012-11-14 NOTE — Progress Notes (Signed)
No egg or soy allergy. emw 

## 2012-11-14 NOTE — Progress Notes (Signed)
Patient did not experience any of the following events: a burn prior to discharge; a fall within the facility; wrong site/side/patient/procedure/implant event; or a hospital transfer or hospital admission upon discharge from the facility. (G8907) Patient did not have preoperative order for IV antibiotic SSI prophylaxis. (G8918)  

## 2012-11-14 NOTE — Progress Notes (Signed)
rEPORT TO PACU RN, VSS, BBS=CLEAR

## 2012-11-14 NOTE — Patient Instructions (Addendum)
YOU HAD AN ENDOSCOPIC PROCEDURE TODAY AT THE [facility]: Refer to the procedure report that was given to you for any specific questions about what was found during the examination.  If the procedure report does not answer your questions, please call your gastroenterologist to clarify.  If you requested that your care partner not be given the details of your procedure findings, then the procedure report has been included in a sealed envelope for you to review at your convenience later.  YOU SHOULD EXPECT: Some feelings of bloating in the abdomen. Passage of more gas than usual.  Walking can help get rid of the air that was put into your GI tract during the procedure and reduce the bloating. If you had a lower endoscopy (such as a colonoscopy or flexible sigmoidoscopy) you may notice spotting of blood in your stool or on the toilet paper. If you underwent a bowel prep for your procedure, then you may not have a normal bowel movement for a few days.  DIET: Your first meal following the procedure should be a light meal and then it is ok to progress to your normal diet.  A half-sandwich or bowl of soup is an example of a good first meal.  Heavy or fried foods are harder to digest and may make you feel nauseous or bloated.  Likewise meals heavy in dairy and vegetables can cause extra gas to form and this can also increase the bloating.  Drink plenty of fluids but you should avoid alcoholic beverages for 24 hours.  ACTIVITY: Your care partner should take you home directly after the procedure.  You should plan to take it easy, moving slowly for the rest of the day.  You can resume normal activity the day after the procedure however you should NOT DRIVE or use heavy machinery for 24 hours (because of the sedation medicines used during the test).    SYMPTOMS TO REPORT IMMEDIATELY: A gastroenterologist can be reached at any hour.  During normal business hours, 8:30 AM to 5:00 PM Monday through Friday,  call [phone].  After hours and on weekends, please call the GI answering service at [phone] who will take a message and have the physician on call contact you.    Following upper endoscopy (EGD)  Vomiting of blood or coffee ground material  New chest pain or pain under the shoulder blades  Painful or persistently difficult swallowing  New shortness of breath  Fever of 100F or higher  Black, tarry-looking stools  FOLLOW UP: If any biopsies were taken you will be contacted by phone or by letter within the next 1-3 weeks.  Call your gastroenterologist if you have not heard about the biopsies in 3 weeks.  Our staff will call the home number listed on your records the next business day following your procedure to check on you and address any questions or concerns that you may have at that time regarding the information given to you following your procedure. This is a courtesy call and so if there is no answer at the home number and we have not heard from you through the emergency physician on call, we will assume that you have returned to your regular daily activities without incident.  SIGNATURES/CONFIDENTIALITY: You and/or your care partner have signed paperwork which will be entered into your electronic medical record.  These signatures attest to the fact that that the information above on your After Visit Summary has been reviewed and is understood.  Full   responsibility of the confidentiality of this discharge information lies with you and/or your care-partner.  Resume medications. Call office to schedule follow up appointment for 1 month. Office staff will notify you of ultra sound appointment.

## 2012-11-17 ENCOUNTER — Other Ambulatory Visit: Payer: Self-pay

## 2012-11-17 ENCOUNTER — Telehealth: Payer: Self-pay | Admitting: Cardiovascular Disease

## 2012-11-17 ENCOUNTER — Ambulatory Visit (INDEPENDENT_AMBULATORY_CARE_PROVIDER_SITE_OTHER): Payer: BC Managed Care – PPO | Admitting: Physician Assistant

## 2012-11-17 ENCOUNTER — Telehealth: Payer: Self-pay | Admitting: *Deleted

## 2012-11-17 ENCOUNTER — Encounter: Payer: Self-pay | Admitting: Physician Assistant

## 2012-11-17 ENCOUNTER — Encounter: Payer: Self-pay | Admitting: Cardiovascular Disease

## 2012-11-17 VITALS — BP 126/78 | HR 64 | Ht 65.0 in | Wt 168.0 lb

## 2012-11-17 DIAGNOSIS — R1013 Epigastric pain: Secondary | ICD-10-CM

## 2012-11-17 DIAGNOSIS — I2510 Atherosclerotic heart disease of native coronary artery without angina pectoris: Secondary | ICD-10-CM

## 2012-11-17 DIAGNOSIS — I10 Essential (primary) hypertension: Secondary | ICD-10-CM

## 2012-11-17 DIAGNOSIS — E785 Hyperlipidemia, unspecified: Secondary | ICD-10-CM

## 2012-11-17 DIAGNOSIS — R079 Chest pain, unspecified: Secondary | ICD-10-CM

## 2012-11-17 NOTE — Telephone Encounter (Signed)
  Follow up Call-  Call back number 11/14/2012  Post procedure Call Back phone  # [phone]  Permission to leave phone message Yes     Patient questions:  Message left to call us if necessary.

## 2012-11-17 NOTE — Telephone Encounter (Signed)
New Prob     Pts husband would like to speak to nurse regarding pts recent visit. Please call.

## 2012-11-17 NOTE — Telephone Encounter (Signed)
Pt was seen by Tereso Newcomer PA today. Pt was told that Dr. Excell Seltzer  was going to come to speak with pt today after seen the PA. Pt is scheduled for a Myoview on 11/25/12. Pt would like to see Dr. Excell Seltzer for  the test results.

## 2012-11-17 NOTE — Patient Instructions (Addendum)
PLEASE SCHEDULE AN EXERCISE MYOVIEW  FOLLOW UP WITH DR. Excell Seltzer IN 03/2013  NO MEDICATION CHANGES WERE MADE TODAY

## 2012-11-17 NOTE — Progress Notes (Signed)
[address] [place], [place]  [address] Phone: [phone] Fax:  [phone]  Date:  11/17/2012   ID:  Cindy [name], DOB Jan 22, 1956, MRN [id number]  PCP:  Pearson Grippe, MD  Cardiologist:  Dr. Tonny Bollman     History of Present Illness: Cindy [name] is a 57 y.o. female who returns for evaluation of chest and epigastric pain and dyspnea.  She has a hx of CAD, s/p stenting of the diagonal branch in 2006, HTN, HL, hypothyroidism. Last LHC 11/2010: Patent Dx stent with 20% ISR, EF 65%. 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 Last seen by Dr. Excell Seltzer 03/2012. Patient was placed on Ranexa at that time.  She had relief of angina with this regimen.  She did have an increase in her symptoms over the last few mos related to stress from her job.  She finally decided to quit her job and her symptoms improved.  Over the last several weeks, she has had fairly constant epigastric and chest pain.  She does note some worsening with eating.  She did see GI.  EGD done last week was normal.  Abdominal US is pending.   She tells me that she is concerned that her EGD was normal and she continues to have these symptoms.  She feels like her current symptoms are somewhat reminiscent of her prior angina before her PCI.  She notes some dyspnea with more extreme activities.  She is NYHA Class II.  No orthopnea, PND, edema.  No syncope or near syncope.     Wt Readings from Last 3 Encounters:  11/17/12 168 lb (76.204 kg)  11/14/12 164 lb 6.4 oz (74.571 kg)  10/30/12 164 lb 6.4 oz (74.571 kg)     Past Medical History  Diagnosis Date  . Coronary artery disease     s/p PCI in 2006, treated with bare metal stent (3.0x55mm Driver placed in diagonal branch);  cath 8/12: mDx stent ok with 20% ISR, EF 65%  . Hypertension   . Dyslipidemia   . Osteoarthritis     Shoulder and hip  . Hypothyroidism   . Arthritis   . Uterine cancer   . Anemia   .  Hyperlipidemia     Current Outpatient Prescriptions  Medication Sig Dispense Refill  . aspirin EC 325 MG tablet Take 325 mg by mouth daily.        Marland Kitchen atorvastatin (LIPITOR) 80 MG tablet Take 1 tablet (80 mg total) by mouth daily.  90 tablet  3  . esomeprazole (NEXIUM) 40 MG capsule Take 1 capsule (40 mg total) by mouth 2 (two) times daily.  50 capsule  0  . ezetimibe (ZETIA) 10 MG tablet Take 1 tablet (10 mg total) by mouth daily.  90 tablet  3  . isosorbide mononitrate (IMDUR) 30 MG 24 hr tablet Take half tablet by mouth as needed      . levothyroxine (SYNTHROID, LEVOTHROID) 100 MCG tablet Take 100 mcg by mouth daily.        Marland Kitchen lisinopril (PRINIVIL,ZESTRIL) 20 MG tablet Take 1 tablet (20 mg total) by mouth daily.  90 tablet  3  . metoprolol (TOPROL-XL) 50 MG 24 hr tablet Take 1 tablet (50 mg total) by mouth daily.  90 tablet  3  . nitroGLYCERIN (NITROSTAT) 0.4 MG SL tablet Place 1 tablet (0.4 mg total) under the tongue every 5 (five) minutes as needed for chest pain.  25 tablet  6  .  ranolazine (RANEXA) 500 MG 12 hr tablet Take 1 tablet (500 mg total) by mouth 2 (two) times daily.  60 tablet  11  . topiramate (TOPAMAX) 25 MG tablet Take 25 mg by mouth daily.        No current facility-administered medications for this visit.    Allergies:   No Known Allergies  Social History:  The patient  reports that she has never smoked. She has never used smokeless tobacco. She reports that  drinks alcohol. She reports that she does not use illicit drugs.   ROS:  Please see the history of present illness.   She notes dysphagia.  No nausea, vomiting, melena, hematochezia, weight loss.  She is frequently constipated.   All other systems reviewed and negative.   PHYSICAL EXAM: VS:  BP 126/78  Pulse 64  Ht 5\' 5"  (1.651 m)  Wt 168 lb (76.204 kg)  BMI 27.96 kg/m2 Well nourished, well developed, in no acute distress HEENT: normal Neck: no JVD Cardiac:  normal S1, S2; RRR; no murmur Lungs:  clear to  auscultation bilaterally, no wheezing, rhonchi or rales Abd: soft, nontender, no hepatomegaly Ext: no edema Skin: warm and dry Neuro:  CNs 2-12 intact, no focal abnormalities noted  EKG:  NSR, HR 64, normal axis, no acute changes     ASSESSMENT AND PLAN:  1. Chest Pain:  Symptoms sound more GI in nature than cardiac. She is concerned and she did have atypical symptoms prior to her PCI.  She also has microvascular angina that seems to be well controlled on Ranexa.  Will proceed with ETT-Myoview.  If low risk, she should continue to follow up with GI for other causes of her symptoms. 2. CAD:  Proceed with myoview as noted.  Continue ASA, statin. 3. Hypertension:  Controlled.  Continue current therapy.  4. Hyperlipidemia:  Continue statin. 5. Disposition:  F/u with Dr. Tonny Bollman in 03/2013 as planned or sooner if myoview high risk.   Signed, Tereso Newcomer, PA-C  11/17/2012 12:51 PM

## 2012-11-21 ENCOUNTER — Other Ambulatory Visit (HOSPITAL_COMMUNITY): Payer: BC Managed Care – PPO

## 2012-11-24 ENCOUNTER — Ambulatory Visit (HOSPITAL_COMMUNITY)
Admission: RE | Admit: 2012-11-24 | Discharge: 2012-11-24 | Disposition: A | Discharge status: Home / Self Care | Payer: BC Managed Care – PPO | Source: Ambulatory Visit | Attending: Gastroenterology | Admitting: Gastroenterology

## 2012-11-24 DIAGNOSIS — R109 Unspecified abdominal pain: Secondary | ICD-10-CM | POA: Insufficient documentation

## 2012-11-24 DIAGNOSIS — R1013 Epigastric pain: Secondary | ICD-10-CM

## 2012-11-25 ENCOUNTER — Ambulatory Visit (HOSPITAL_COMMUNITY): Payer: BC Managed Care – PPO | Attending: Cardiology | Admitting: Radiology

## 2012-11-25 VITALS — BP 138/67 | Ht 65.0 in | Wt 165.0 lb

## 2012-11-25 DIAGNOSIS — R079 Chest pain, unspecified: Secondary | ICD-10-CM

## 2012-11-25 DIAGNOSIS — R0602 Shortness of breath: Secondary | ICD-10-CM

## 2012-11-25 DIAGNOSIS — R0609 Other forms of dyspnea: Secondary | ICD-10-CM | POA: Insufficient documentation

## 2012-11-25 DIAGNOSIS — Z8249 Family history of ischemic heart disease and other diseases of the circulatory system: Secondary | ICD-10-CM | POA: Insufficient documentation

## 2012-11-25 DIAGNOSIS — I10 Essential (primary) hypertension: Secondary | ICD-10-CM | POA: Insufficient documentation

## 2012-11-25 DIAGNOSIS — I2510 Atherosclerotic heart disease of native coronary artery without angina pectoris: Secondary | ICD-10-CM

## 2012-11-25 DIAGNOSIS — R0989 Other specified symptoms and signs involving the circulatory and respiratory systems: Secondary | ICD-10-CM | POA: Insufficient documentation

## 2012-11-25 DIAGNOSIS — Z9861 Coronary angioplasty status: Secondary | ICD-10-CM | POA: Insufficient documentation

## 2012-11-25 MED ORDER — TECHNETIUM TC 99M SESTAMIBI GENERIC - CARDIOLITE
30.0000 | Freq: Once | INTRAVENOUS | Status: AC | PRN
  Administered 2012-11-25: 30 via INTRAVENOUS

## 2012-11-25 MED ORDER — TECHNETIUM TC 99M SESTAMIBI GENERIC - CARDIOLITE
10.0000 | Freq: Once | INTRAVENOUS | Status: AC | PRN
  Administered 2012-11-25: 10 via INTRAVENOUS

## 2012-11-25 NOTE — Progress Notes (Signed)
  [facility] SITE 3 NUCLEAR MED [address] [place], [place] [address] [phone]    Cardiology Nuclear Med Cindy [name] is a 57 y.o. female     MRN : [id number]     DOB: November 23, 1955  Procedure Date: 11/25/2012  Nuclear Med Background Indication for Stress Test:  Evaluation for Ischemia and Stent Patency History:  '06 Stents LAD in [place], '08 MPS: after Stent NL per pt in [place] 2012  Heart Cath: EF: 65% Stent  20% instent restenosis  Cardiac Risk Factors: Family History - CAD, Hypertension and Lipids  Symptoms:  Chest Pain, DOE and SOB   Nuclear Pre-Procedure Caffeine/Decaff Intake:  None NPO After: 8:00pm   Lungs:  clear O2 Sat: 97% on room air. IV 0.9% NS with Angio Cath:  22g  IV Site: R Hand  IV Started by:  Cathlyn Parsons, RN  Chest Size (in):  36 Cup Size: C  Height: 5\' 5"  (1.651 m)  Weight:  165 lb (74.844 kg)  BMI:  Body mass index is 27.46 kg/(m^2). Tech Comments:  No Toprol taken in 36 hrs    Nuclear Med Study 1 or 2 day study: 1 day  Stress Test Type:  Stress  Reading MD: Marca Ancona, MD  Order Authorizing Provider:  Casimiro Needle Cooper,MD  Resting Radionuclide: Technetium 37m Sestamibi  Resting Radionuclide Dose: 11.0 mCi   Stress Radionuclide:  Technetium 79m Sestamibi  Stress Radionuclide Dose: 33.0 mCi           Stress Protocol Rest HR: 60 Stress HR: 169  Rest BP: 138/67 Stress BP: 166/84  Exercise Time (min): 7:11 METS: 8.80   Predicted Max HR: 164 bpm % Max HR: 103.05 bpm Rate Pressure Product: 84132   Dose of Adenosine (mg):  n/a Dose of Lexiscan: n/a mg  Dose of Atropine (mg): n/a Dose of Dobutamine: n/a mcg/kg/min (at max HR)  Stress Test Technologist: Milana Na, EMT-P  Nuclear Technologist:  Harlow Asa, CNMT     Rest Procedure:  Myocardial perfusion imaging was performed at rest 45 minutes following the intravenous administration of Technetium 41m Sestamibi. Rest ECG: NSR - Normal EKG  Stress Procedure:  The  patient exercised on the treadmill utilizing the Bruce Protocol for 7:11 minutes. The patient stopped due to sob and denied any chest pain.  Technetium 56m Sestamibi was injected at peak exercise and myocardial perfusion imaging was performed after a brief delay. Stress ECG: Insignificant upsloping ST segment depression.  QPS Raw Data Images:  Normal; no motion artifact; normal heart/lung ratio. Stress Images:  Small, mild mid anterior perfusion defect. Rest Images:  Small, mild mid anterior perfusion defect.  Subtraction (SDS):  Fixed small mild mid anterior perfusion defect.  Transient Ischemic Dilatation (Normal <1.22):  n/a Lung/Heart Ratio (Normal <0.45):  0.31  Quantitative Gated Spect Images QGS EDV:  58ml QGS ESV:  12 ml  Impression Exercise Capacity:  Fair exercise capacity. BP Response:  Normal blood pressure response. Clinical Symptoms:  Dyspnea, no chest pain.  ECG Impression:  Insignificant upsloping ST segment depression. Comparison with Prior Nuclear Study: No images to compare  Overall Impression:  Low risk stress nuclear study with a fixed small mild mid anterior perfusion defect.  Given normal wall motion, I suspect that this represents diaphragmatic attenuation.  No ischemia. .  LV Ejection Fraction: 80%.  LV Wall Motion:  NL LV Function; NL Wall Motion  Marca Ancona 11/25/2012

## 2012-11-25 NOTE — Telephone Encounter (Signed)
Myoview was low risk, no significant ischemia.

## 2012-11-26 ENCOUNTER — Encounter: Payer: Self-pay | Admitting: Physician Assistant

## 2012-11-26 NOTE — Telephone Encounter (Signed)
Dr Excell Seltzer called the pt and left a message for her to contact the office about stress test results.  He will speak with the pt.

## 2012-12-02 ENCOUNTER — Encounter: Payer: BC Managed Care – PPO | Admitting: Gastroenterology

## 2012-12-04 NOTE — Telephone Encounter (Signed)
Left message on machine for pt to contact the office. If the pt is still having symptoms then she should be scheduled for follow-up with Dr Excell Seltzer.

## 2012-12-16 ENCOUNTER — Encounter: Payer: Self-pay | Admitting: Gastroenterology

## 2012-12-16 ENCOUNTER — Ambulatory Visit (INDEPENDENT_AMBULATORY_CARE_PROVIDER_SITE_OTHER): Payer: BC Managed Care – PPO | Admitting: Gastroenterology

## 2012-12-16 VITALS — BP 128/82 | HR 68 | Ht 65.0 in | Wt 168.0 lb

## 2012-12-16 DIAGNOSIS — R1013 Epigastric pain: Secondary | ICD-10-CM

## 2012-12-16 MED ORDER — ESOMEPRAZOLE MAGNESIUM 40 MG PO CPDR: 40.0000 mg | DELAYED_RELEASE_CAPSULE | Freq: Every day | ORAL | Status: DC

## 2012-12-16 NOTE — Patient Instructions (Addendum)
We have sent the following medications to your pharmacy for you to pick up at your convenience: Nexium 40 mg one tablet by mouth once daily.  Continue follow up with Dr. Selena Batten regarding your blood sugar levels.   Thank you for choosing me and [facility].  Venita Lick. Pleas Koch., MD., Clementeen Graham  cc: Pearson Grippe, MD

## 2012-12-16 NOTE — Progress Notes (Signed)
History of Present Illness: This is a 57 year old female seen initially with lower chest pain, epigastric pain and nausea. Upper endoscopy and abdominal ultrasound were unremarkable. She was placed on Nexium 40 mg twice daily. Her symptoms have significantly improved although she does still note mild pain and nausea. She states that she's had elevated blood sugars that has been monitored by Dr. Selena Batten.  Current Medications, Allergies, Past Medical History, Past Surgical History, Family History and Social History were reviewed in [employer] record.  Physical Exam: General: Well developed , well nourished, no acute distress Head: Normocephalic and atraumatic Eyes:  sclerae anicteric, EOMI Ears: Normal auditory acuity Mouth: No deformity or lesions Lungs: Clear throughout to auscultation Heart: Regular rate and rhythm; no murmurs, rubs or bruits Abdomen: Soft, non tender and non distended. No masses, hepatosplenomegaly or hernias noted. Normal Bowel sounds Musculoskeletal: Symmetrical with no gross deformities  Pulses:  Normal pulses noted Extremities: No clubbing, cyanosis, edema or deformities noted Neurological: Alert oriented x 4, grossly nonfocal Psychological:  Alert and cooperative. Anxious.  Assessment and Recommendations:  1. Epigastric, chest pain. EGD and [place] negative. Symptoms improved but no clear GI cause has been uncovered. Possible GERD symptoms atypical and not fully responsive to aggressive therapy. Return to Dr. Selena Batten to address hyperglycemia as this may be leading to dyspeptic symptoms. Change Nexium to 40 mg daily. Consider GES and CCK-HIDA if symptoms do not resolve.

## 2013-02-26 ENCOUNTER — Other Ambulatory Visit: Payer: Self-pay

## 2013-05-25 ENCOUNTER — Telehealth: Payer: Self-pay

## 2013-05-25 NOTE — Telephone Encounter (Signed)
Received fax stating patients insurance company no longer covers the Nexium and the preferred PPI's are Prilosec, Protonix, Dexilant, or Acipehx. Left a message for patient to call me back.

## 2013-05-26 NOTE — Telephone Encounter (Signed)
Left a message for patient to return my call. 

## 2013-05-28 NOTE — Telephone Encounter (Signed)
Left a message for patient to return my call. 

## 2014-03-16 ENCOUNTER — Other Ambulatory Visit: Payer: Self-pay | Admitting: Orthopedic Surgery

## 2014-03-16 DIAGNOSIS — M545 Low back pain, unspecified: Secondary | ICD-10-CM

## 2014-03-29 ENCOUNTER — Other Ambulatory Visit: Payer: BC Managed Care – PPO

## 2014-03-31 ENCOUNTER — Ambulatory Visit
Admission: RE | Admit: 2014-03-31 | Discharge: 2014-03-31 | Disposition: A | Discharge status: Home / Self Care | Payer: 59 | Source: Ambulatory Visit | Attending: Orthopedic Surgery | Admitting: Orthopedic Surgery

## 2014-03-31 DIAGNOSIS — M545 Low back pain, unspecified: Secondary | ICD-10-CM

## 2014-12-02 ENCOUNTER — Other Ambulatory Visit: Payer: Self-pay | Admitting: Neurosurgery

## 2015-02-11 ENCOUNTER — Encounter (HOSPITAL_COMMUNITY)
Admission: RE | Admit: 2015-02-11 | Discharge: 2015-02-11 | Disposition: A | Discharge status: Home / Self Care | Payer: 59 | Source: Ambulatory Visit | Attending: Neurosurgery | Admitting: Neurosurgery

## 2015-02-11 ENCOUNTER — Encounter (HOSPITAL_COMMUNITY): Payer: Self-pay

## 2015-02-11 ENCOUNTER — Other Ambulatory Visit: Payer: Self-pay

## 2015-02-11 DIAGNOSIS — F329 Major depressive disorder, single episode, unspecified: Secondary | ICD-10-CM | POA: Insufficient documentation

## 2015-02-11 DIAGNOSIS — E785 Hyperlipidemia, unspecified: Secondary | ICD-10-CM | POA: Diagnosis not present

## 2015-02-11 DIAGNOSIS — E039 Hypothyroidism, unspecified: Secondary | ICD-10-CM | POA: Insufficient documentation

## 2015-02-11 DIAGNOSIS — K219 Gastro-esophageal reflux disease without esophagitis: Secondary | ICD-10-CM | POA: Insufficient documentation

## 2015-02-11 DIAGNOSIS — Z01818 Encounter for other preprocedural examination: Secondary | ICD-10-CM | POA: Insufficient documentation

## 2015-02-11 DIAGNOSIS — F419 Anxiety disorder, unspecified: Secondary | ICD-10-CM | POA: Insufficient documentation

## 2015-02-11 DIAGNOSIS — Z01812 Encounter for preprocedural laboratory examination: Secondary | ICD-10-CM | POA: Diagnosis not present

## 2015-02-11 DIAGNOSIS — I2510 Atherosclerotic heart disease of native coronary artery without angina pectoris: Secondary | ICD-10-CM | POA: Diagnosis not present

## 2015-02-11 DIAGNOSIS — M4310 Spondylolisthesis, site unspecified: Secondary | ICD-10-CM | POA: Diagnosis not present

## 2015-02-11 DIAGNOSIS — Z955 Presence of coronary angioplasty implant and graft: Secondary | ICD-10-CM | POA: Diagnosis not present

## 2015-02-11 DIAGNOSIS — Z79899 Other long term (current) drug therapy: Secondary | ICD-10-CM | POA: Diagnosis not present

## 2015-02-11 HISTORY — DX: Gastro-esophageal reflux disease without esophagitis: K21.9

## 2015-02-11 HISTORY — DX: Angina pectoris, unspecified: I20.9

## 2015-02-11 HISTORY — DX: Major depressive disorder, single episode, unspecified: F32.9

## 2015-02-11 HISTORY — DX: Cardiac arrhythmia, unspecified: I49.9

## 2015-02-11 HISTORY — DX: Depression, unspecified: F32.A

## 2015-02-11 HISTORY — DX: Pneumonia, unspecified organism: J18.9

## 2015-02-11 HISTORY — DX: Anxiety disorder, unspecified: F41.9

## 2015-02-11 LAB — BASIC METABOLIC PANEL
Anion gap: 9 (ref 5–15)
BUN: 12 mg/dL (ref 6–20)
CHLORIDE: 107 mmol/L (ref 101–111)
CO2: 25 mmol/L (ref 22–32)
CREATININE: 0.75 mg/dL (ref 0.44–1.00)
Calcium: 9.4 mg/dL (ref 8.9–10.3)
GFR calc non Af Amer: >60 mL/min (ref >60)
Glucose, Bld: 95 mg/dL (ref 65–99)
Potassium: 3.9 mmol/L (ref 3.5–5.1)
Sodium: 141 mmol/L (ref 135–145)

## 2015-02-11 LAB — CBC
HCT: 38.5 % (ref 36.0–46.0)
HEMOGLOBIN: 12.6 g/dL (ref 12.0–15.0)
MCH: 29.7 pg (ref 26.0–34.0)
MCHC: 32.7 g/dL (ref 30.0–36.0)
MCV: 90.8 fL (ref 78.0–100.0)
PLATELETS: 250 10*3/uL (ref 150–400)
RBC: 4.24 MIL/uL (ref 3.87–5.11)
RDW: 13.3 % (ref 11.5–15.5)
WBC: 8.9 10*3/uL (ref 4.0–10.5)

## 2015-02-11 LAB — SURGICAL PCR SCREEN
MRSA, PCR: NEGATIVE
Staphylococcus aureus: NEGATIVE

## 2015-02-11 LAB — NO BLOOD PRODUCTS

## 2015-02-11 NOTE — Progress Notes (Signed)
PCP is Dr Jani Gravel Cardiologist is Dr Sherren Mocha States her last echo was 2006 Stress test noted in epic form 11-25-12 Pt reports her lase cath was in 2012 here at [facility]. Cindy [name] refuses blood products- refusal faxed to lab.- No type and screen was done due to pt refuses.

## 2015-02-11 NOTE — Pre-Procedure Instructions (Addendum)
[facility]  02/11/2015      [id number] - [place], [place] [address] [place] [address] Phone: [phone] Fax: [phone]  [facility] [place], [place] [address] [place] [address] Phone: [phone] Fax: [phone]    Your procedure is scheduled on   Report to [place] at 630 A.M.  Call this number if you have problems the morning of surgery:  [phone]   Remember:  Do not eat food or drink liquids after midnight.  Take these medicines the morning of surgery with A SIP OF WATER esomeprazole (Nexium), levothyroxine (Synthroid), Metoprolol (Toprol-XL), Nitroglycerin if needed, Sertraline (Zoloft)  Stop taking aspirin, ibuprofen, BC's, Goody's,herbal medications, Fish Oil,   Do not wear jewelry, make-up or nail polish.  Do not wear lotions, powders, or perfumes.  You may wear deodorant.  Do not shave 48 hours prior to surgery.  Men may shave face and neck.  Do not bring valuables to the hospital.  [facility] is not responsible for any belongings or valuables.  Contacts, dentures or bridgework may not be worn into surgery.  Leave your suitcase in the car.  After surgery it may be brought to your room.  For patients admitted to the hospital, discharge time will be determined by your treatment team.  Patients discharged the day of surgery will not be allowed to drive home.    Special instructions:  [facility] - Preparing for Surgery  Before surgery, you can play an important role.  Because skin is not sterile, your skin needs to be as free of germs as possible.  You can reduce the number of germs on you skin by washing with CHG (chlorahexidine gluconate) soap before surgery.  CHG is an antiseptic cleaner which kills germs and bonds with the skin to continue killing germs even after washing.  Please DO NOT use  if you have an allergy to CHG or antibacterial soaps.  If your skin becomes reddened/irritated stop using the CHG and inform your nurse when you arrive at Short Stay.  Do not shave (including legs and underarms) for at least 48 hours prior to the first CHG shower.  You may shave your face.  Please follow these instructions carefully:   1.  Shower with CHG Soap the night before surgery and the morning of Surgery.  2.  If you choose to wash your hair, wash your hair first as usual with your normal shampoo.  3.  After you shampoo, rinse your hair and body thoroughly to remove the  Shampoo.  4.  Use CHG as you would any other liquid soap.  You can apply chg directly to the skin and wash gently with scrungie or a clean washcloth.  5.  Apply the CHG Soap to your body ONLY FROM THE NECK DOWN.        Do not use on open wounds or open sores.  Avoid contact with your eyes,  ears, mouth and genitals (private parts).  Wash genitals (private parts)  with your normal soap.  6.  Wash thoroughly, paying special attention to the area where your surgery will be performed.  7.  Thoroughly rinse your body with warm water from the neck down.  8.  DO NOT shower/wash with your normal soap after using and rinsing off the CHG Soap.  9.  Pat yourself dry with a clean towel.  10.  Wear clean pajamas.            11.  Place clean sheets on your bed the night of your first shower and do not sleep with pets.  Day of Surgery  Do not apply any lotions/deoderants the morning of surgery.  Please wear clean clothes to the hospital/surgery center.     Please read over the following fact sheets that you were given. Pain Booklet, Coughing and Deep Breathing, Blood Transfusion Information, MRSA Information and Surgical Site Infection Prevention

## 2015-02-14 ENCOUNTER — Encounter (HOSPITAL_COMMUNITY): Payer: Self-pay

## 2015-02-14 NOTE — Progress Notes (Signed)
Anesthesia Chart Review: Patient is a 59 year old female scheduled for L4-5 PLIF on 02/23/15 by Dr. Saintclair Halsted.  History includes non-smoker, CAD s/p BMS DIAG '06 ([place]), dyslipidemia, hypothyroidism, anemia, uterine cancer s/p hysterectomy/BSO '08, GERD, depression, anxiety, T&A, dysrhythmia (tachycardia if misses Toprol for > 2 days).  PCP is Dr. Jani Gravel. Cardiologist is Dr. Sherren Mocha with last cardiology visit with Richardson Dopp, PA-C on 11/17/12 for evaluation of chest/epigastric pain and dyspnea. She had a low risk stress test at that time.   Patient reports she recently saw Dr. Maudie Mercury, and he is aware of plans for surgery. She was also notified at Dr. Windy Carina office that they had received a signed note of cardiac clearance from Dr. Burt Knack. I found the signed letter of cardiac clearance from Dr. Burt Knack dated 12/07/14 scanned under the Media tab. Because I did not see a cardiology office visit since 2014, I called to speak with patient. She reports she feels she is doing very well from a cardiac standpoint. She has not had chest pain for at least  ~ 1 1/2 years, so is no longer on Ranexa and isosorbide. Her walking is now limited to 10 minutes at a time due to right hip and leg pain, but was previously walking 2 miles on a regular basis without CV symptoms. She is still actively going to the gym at least 3X/week and riding on a stationary bike for 40-45 minutes (non-stop) at a pace of 10 miles/hr without CV symptoms. She is able to get her HR up to about 140 bpm. Recently she was able to ride a bike for 20 miles while visiting [facility]. She denied chest pain, SOB, or edema.   Meds include ASA 325 mg (reported being instructed to hold for five days preoperatively), Zetia, levothyroxine, lisinpril, Toprol XL (brand only), Nitro, Livalo, Zoloft.  02/11/15 EKG: NSR.  11/25/12 Nuclear stress test: Overall Impression: Low risk stress nuclear study with a fixed small mild mid anterior perfusion defect. Given  normal wall motion, I suspect that this represents diaphragmatic attenuation. No ischemia. LV Ejection Fraction: 80%. LV Wall Motion: NL LV Function; NL Wall Motion.  11/27/10 LHC (Dr. Angelena Form): ANGIOGRAPHIC FINDINGS: 1. Left main coronary artery had no evidence of disease. 2. Left anterior descending was a large vessel that coursed to the apex and gave off a moderate-sized diagonal branch. The left anterior descending artery had mild luminal irregularities in the proximal mid segment, but no focally obstructive lesions. The diagonal branch was a moderate-sized vessel with a patent stent in the midportion of this vessel. There appeared to be approximately 20% diffuse in-stent restenosis, but this did not appear to be flow limiting. 3. The circumflex artery gave rise to an early obtuse marginal branch followed by a moderate-sized second obtuse marginal branch. All these vessels were patent, but no obstructive disease. 4. The right coronary artery is a large dominant vessel with no evidence of disease. 5. Normal left ventricular systolic function with ejection fraction of 65%. IMPRESSION: 1. Stable single-vessel coronary artery disease. 2. Normal left ventricular function. RECOMMENDATIONS: I would recommend continued medical management at this time. The patient has been started on Imdur in the office and has felt better since she started this medication. There is a possibility that she does have small vessel disease with a good response to the Imdur.  Preoperative labs noted. CBC and BMET WNL. She refused blood products.   Above reviewed with anesthesiologist Dr. [place] Pink. Patient has known 1V CAD  with signed note of cardiac clearance by Dr. Burt Knack. Cath within 5 year showed stable 1V disease (20% in-stent restenosis DAIG, not flow limiting) and non-ischemic stress test just over two years ago. Her EKG is WNL, and she reports current routine aerobic exercise (> 6 METS) as discussed above. If no  acute changes then it is anticipated that she can proceed as planned.  George Hugh [facility] Short Stay Center/Anesthesiology Phone [phone] 02/14/2015 4:40 PM

## 2015-02-15 ENCOUNTER — Telehealth: Payer: Self-pay | Admitting: Cardiovascular Disease

## 2015-02-15 NOTE — Telephone Encounter (Signed)
New message      Request for surgical clearance:  What type of surgery is being performed? Lumbar fusion 1. When is this surgery scheduled? 02-23-15  Are there any medications that need to be held prior to surgery and how long? Pt has been cleared for surgery but stopping aspirin 5 day prior was not addressed 2. Name of physician performing surgery? Dr Saintclair Halsted  3. What is your office phone and fax number? Fax [phone]

## 2015-02-15 NOTE — Telephone Encounter (Signed)
This pt has not been seen by our office since 2014. I spoke with Lorriane Shire and made her aware of this information.  Per Lorriane Shire she has a signed form from our office in regards to clearance.  I made her aware that I cannot locate this form in the computer system.  She will fax a copy of form to our office for my review.

## 2015-02-16 NOTE — Telephone Encounter (Signed)
I spoke with the pt and she is currently on vacation with her family and will not be returning until late 02/21/15.  The pt will need to be scheduled on the FLEX schedule 02/22/15 as her surgery is scheduled 02/23/15. The pt can be contacted on her mobile phone.  I will have a scheduler contact her with an appointment.  The pt also needed approval to hold her ASA 5 days prior to surgery. Per Dr Burt Knack the pt can go ahead and hold her ASA 5 days prior to 02/23/15 surgery.  The pt will need to be made aware of this instruction.

## 2015-02-16 NOTE — Telephone Encounter (Signed)
Please arrange APP visit for preop clearance - hasn't been seen since 2014.  Cindy [name] 02/16/2015 5:26 PM

## 2015-02-17 NOTE — Telephone Encounter (Signed)
Spoke with patient and advised her of availability for appointment Tuesday.  She is in agreement to see Richardson Dopp, PA on Tuesday 11/1 at 11:30.  I advised her she may stop her aspirin as surgery is scheduled for Wed. 11/2.  She verbalized understanding and agreement and thanked me for the call.

## 2015-02-18 NOTE — Telephone Encounter (Signed)
Telephone encounter faxed to [phone].

## 2015-02-21 NOTE — Progress Notes (Signed)
Cardiology Office Note   Date:  02/22/2015   ID:  Cindy [name], DOB 07/28/1955, MRN [id number]  PCP:  Jani Gravel, MD  Cardiologist:  Dr. Sherren Mocha  Electrophysiologist:  n/a  Chief Complaint  Patient presents with  . Other    Surgical Clearance (Surgery is TOMORROW)  . Coronary Artery Disease     History of Present Illness: Cindy [name] is a 59 y.o. female with a hx of CAD, s/p stenting of the diagonal branch in 2006, HTN, HL, hypothyroidism. Last LHC 11/2010: Patent Dx stent with 20% ISR, EF 65%. 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 Last seen by Dr. Burt Knack 03/2012. Patient was placed on Ranexa at that time. She had relief of angina with this regimen. Last seen in clinic by me in 2014.  She now needs cardiac clearance for L4-L5 fusion with Dr. Kary Kos.  Surgery is schedule for tomorrow.   She is doing well.  No angina.  No shortness of breath.  No syncope.  No orthopnea, PND, edema.  She rides a stationary bike 3 x a week for 45 minutes without difficulty.  She is able to do routine household activities without limitation (vacuum, carry heavy objects, navigate steps, etc).     Studies/Reports Reviewed Today:  Myoview 8/14 Low risk stress nuclear study with a fixed small mild mid anterior perfusion defect. Given normal wall motion, I suspect that this represents diaphragmatic attenuation. No ischemia. LV Ejection Fraction: 80%. LV Wall Motion: NL LV Function; NL Wall Motion   Past Medical History  Diagnosis Date  . Coronary artery disease     s/p PCI in 2006, treated with bare metal stent (3.0x52mm Driver placed in diagonal branch);  cath 8/12: mDx stent ok with 20% ISR, EF 65%;  ETT-Myoview 8/14:  Low risk, EF 80%, anterior defect likely represents diaphragmatic atten, normal wall motion  . Hypertension   . Dyslipidemia   . Osteoarthritis     Shoulder and hip  . Hypothyroidism   . Arthritis   . Uterine cancer ([place])    . Anemia   . Hyperlipidemia   . Anginal pain ([place])   . Pneumonia   . Depression   . Anxiety   . GERD (gastroesophageal reflux disease)   . Dysrhythmia     tachycardia if she misses Toprol    Past Surgical History  Procedure Laterality Date  . Total abdominal hysterectomy w/ bilateral salpingoophorectomy  2008  . Tonsillectomy and adenoidectomy    . Cardiac catheterization    . Colonoscopy    . Upper gastrointestinal endoscopy    . Abdominal hysterectomy       Current Outpatient Prescriptions  Medication Sig Dispense Refill  . aspirin EC 325 MG tablet Take 325 mg by mouth daily.      Marland Kitchen ezetimibe (ZETIA) 10 MG tablet Take 1 tablet (10 mg total) by mouth daily. 90 tablet 3  . levothyroxine (SYNTHROID, LEVOTHROID) 100 MCG tablet Take 100 mcg by mouth daily.      Marland Kitchen lisinopril (PRINIVIL,ZESTRIL) 20 MG tablet Take 1 tablet (20 mg total) by mouth daily. 90 tablet 3  . metoprolol (TOPROL-XL) 50 MG 24 hr tablet Take 1 tablet (50 mg total) by mouth daily. 90 tablet 3  . Pitavastatin Calcium (LIVALO) 4 MG TABS Take 4 mg by mouth daily.    . sertraline (ZOLOFT) 50 MG tablet Take 50 mg by mouth daily.  6  . nitroGLYCERIN (NITROSTAT) 0.4 MG  SL tablet Place 1 tablet (0.4 mg total) under the tongue every 5 (five) minutes as needed for chest pain. 25 tablet 6   No current facility-administered medications for this visit.    Allergies:   Morphine and related    Social History:   Social History   Social History  . Marital Status: Married    Spouse Name: N/A  . Number of Children: 2  . Years of Education: N/A   Occupational History  . [employer]   .     Social History Main Topics  . Smoking status: Never Smoker   . Smokeless tobacco: Never Used     Comment: Tobacco use-no  . Alcohol Use: Yes     Comment: Social  . Drug Use: No  . Sexual Activity: Not Asked   Other Topics Concern  . None   Social History Narrative   Recently moved to [place] from [place].    Family  History:   Family History  Problem Relation Age of Onset  . Heart attack Mother 47  . Diabetes Mother   . Heart disease Maternal Grandmother   . Colon cancer Neg Hx       ROS:   Please see the history of present illness.   Review of Systems  Respiratory: Positive for cough.   Musculoskeletal: Positive for back pain, joint pain and myalgias.  Gastrointestinal: Positive for constipation.  Neurological: Positive for loss of balance.  Psychiatric/Behavioral: Positive for depression. The patient is nervous/anxious.   All other systems reviewed and are negative.     PHYSICAL EXAM: VS:  BP 140/82 mmHg  Pulse 63  Ht 5\' 5"  (1.651 m)  Wt 175 lb 6.4 oz (79.561 kg)  BMI 29.19 kg/m2    Wt Readings from Last 3 Encounters:  02/22/15 175 lb 6.4 oz (79.561 kg)  02/11/15 175 lb (79.379 kg)  12/16/12 168 lb (76.204 kg)     GEN: Well nourished, well developed, in no acute distress HEENT: normal Neck: no JVD, no carotid bruits, no masses Cardiac:  Normal S1/S2, RRR; no murmur ,  no rubs or gallops, no edema   Respiratory:  clear to auscultation bilaterally, no wheezing, rhonchi or rales. GI: soft, nontender, nondistended, + BS MS: no deformity or atrophy Skin: warm and dry  Neuro:  CNs II-XII intact, Strength and sensation are intact Psych: Normal affect   EKG:  EKG is ordered today.  It demonstrates:   NSR, HR 63, normal axis, QTc 427 ms, no change from prior tracing.    Recent Labs: 02/11/2015: BUN 12; Creatinine, Ser 0.75; Hemoglobin 12.6; Platelets 250; Potassium 3.9; Sodium 141    Lipid Panel No results found for: CHOL, TRIG, HDL, CHOLHDL, VLDL, LDLCALC, LDLDIRECT    ASSESSMENT AND PLAN:  1. Surgical Clearance:  The patient does not have any unstable cardiac conditions.  Upon evaluation today, she can achieve 4 METs or greater without anginal symptoms.  According to [facility] and AHA guidelines, she requires no further cardiac workup prior to her noncardiac surgery and should be  at acceptable risk.  Our service is available as necessary in the perioperative period. Ideally, we would recommend continuing ASA throughout the peri-operative period. If her bleeding risk is too great, ASA should be resumed postoperatively as soon as it is felt to be safe.    2. CAD:  S/p BMS to the Diag in 2006.  LHC in 2012 with patent Diag stent and Myoview in 2014 with no ischemia.  She rides  a stationary bike 3 x a week without angina.  Continue ASA, statin, beta-blocker.  3. HTN:  Borderline elevated. Continue to monitor.    4. Hyperlipidemia:  Managed by PCP.  Request most recent Lipids.      Medication Changes: Current medicines are reviewed at length with the patient today.  Concerns regarding medicines are as outlined above.  The following changes have been made:   Discontinued Medications   ATORVASTATIN (LIPITOR) 80 MG TABLET    Take 1 tablet (80 mg total) by mouth daily.   ESOMEPRAZOLE (NEXIUM) 40 MG CAPSULE    Take 1 capsule (40 mg total) by mouth daily before breakfast.   RANOLAZINE (RANEXA) 500 MG 12 HR TABLET    Take 1 tablet (500 mg total) by mouth 2 (two) times daily.   Modified Medications   No medications on file   New Prescriptions   No medications on file   Labs/ tests ordered today include:   Orders Placed This Encounter  Procedures  . EKG 12-Lead      Disposition:    FU with Dr. Sherren Mocha 1 year.     Signed, Cindy [name], MHS 02/22/2015 12:11 PM    [facility] [place], [place],   [address] Phone: [phone]; Fax: [phone]

## 2015-02-22 ENCOUNTER — Encounter: Payer: Self-pay | Admitting: Physician Assistant

## 2015-02-22 ENCOUNTER — Ambulatory Visit (INDEPENDENT_AMBULATORY_CARE_PROVIDER_SITE_OTHER): Payer: 59 | Admitting: Physician Assistant

## 2015-02-22 VITALS — BP 140/82 | HR 63 | Ht 65.0 in | Wt 175.4 lb

## 2015-02-22 DIAGNOSIS — I2510 Atherosclerotic heart disease of native coronary artery without angina pectoris: Secondary | ICD-10-CM

## 2015-02-22 DIAGNOSIS — E785 Hyperlipidemia, unspecified: Secondary | ICD-10-CM | POA: Diagnosis not present

## 2015-02-22 DIAGNOSIS — Z01810 Encounter for preprocedural cardiovascular examination: Secondary | ICD-10-CM

## 2015-02-22 DIAGNOSIS — I10 Essential (primary) hypertension: Secondary | ICD-10-CM

## 2015-02-22 MED ORDER — CEFAZOLIN SODIUM-DEXTROSE 2-3 GM-% IV SOLR
2.0000 g | INTRAVENOUS | Status: AC
  Administered 2015-02-23: 2 g via INTRAVENOUS
  Filled 2015-02-22: qty 50

## 2015-02-22 MED ORDER — DEXAMETHASONE SODIUM PHOSPHATE 10 MG/ML IJ SOLN
10.0000 mg | INTRAMUSCULAR | Status: AC
  Administered 2015-02-23: 10 mg via INTRAVENOUS
  Filled 2015-02-22: qty 1

## 2015-02-22 NOTE — Patient Instructions (Signed)
Medication Instructions:  Your physician recommends that you continue on your current medications as directed. Please refer to the Current Medication list given to you today.   Labwork: NONE  Testing/Procedures: NONE  Follow-Up: 1 YEAR WITH DR. Burt Knack; WE WILL CALL YOU A COUPLE OF MONTHS EARLIER TO SCHEDULE APPT  Any Other Special Instructions Will Be Listed Below (If Applicable).     If you need a refill on your cardiac medications before your next appointment, please call your pharmacy.

## 2015-02-23 ENCOUNTER — Encounter (HOSPITAL_COMMUNITY)
Admission: AD | Disposition: A | Discharge status: Home / Self Care | Payer: Self-pay | Source: Ambulatory Visit | Attending: Neurosurgery

## 2015-02-23 ENCOUNTER — Encounter: Payer: 59 | Admitting: Physician Assistant

## 2015-02-23 ENCOUNTER — Inpatient Hospital Stay (HOSPITAL_COMMUNITY): Payer: 59

## 2015-02-23 ENCOUNTER — Encounter (HOSPITAL_COMMUNITY): Payer: Self-pay | Admitting: *Deleted

## 2015-02-23 ENCOUNTER — Inpatient Hospital Stay (HOSPITAL_COMMUNITY): Payer: 59 | Admitting: Vascular Surgery

## 2015-02-23 ENCOUNTER — Inpatient Hospital Stay (HOSPITAL_COMMUNITY)
Admission: AD | Admit: 2015-02-23 | Discharge: 2015-02-28 | DRG: 460 | Disposition: A | Discharge status: Home / Self Care | Payer: 59 | Source: Ambulatory Visit | Attending: Neurosurgery | Admitting: Neurosurgery

## 2015-02-23 ENCOUNTER — Inpatient Hospital Stay (HOSPITAL_COMMUNITY): Payer: 59 | Admitting: Anesthesiology

## 2015-02-23 DIAGNOSIS — Z7982 Long term (current) use of aspirin: Secondary | ICD-10-CM

## 2015-02-23 DIAGNOSIS — Z8542 Personal history of malignant neoplasm of other parts of uterus: Secondary | ICD-10-CM | POA: Diagnosis not present

## 2015-02-23 DIAGNOSIS — Z955 Presence of coronary angioplasty implant and graft: Secondary | ICD-10-CM | POA: Diagnosis not present

## 2015-02-23 DIAGNOSIS — Z885 Allergy status to narcotic agent status: Secondary | ICD-10-CM | POA: Diagnosis not present

## 2015-02-23 DIAGNOSIS — F329 Major depressive disorder, single episode, unspecified: Secondary | ICD-10-CM | POA: Diagnosis present

## 2015-02-23 DIAGNOSIS — M79604 Pain in right leg: Secondary | ICD-10-CM | POA: Diagnosis present

## 2015-02-23 DIAGNOSIS — K219 Gastro-esophageal reflux disease without esophagitis: Secondary | ICD-10-CM | POA: Diagnosis present

## 2015-02-23 DIAGNOSIS — Z419 Encounter for procedure for purposes other than remedying health state, unspecified: Secondary | ICD-10-CM

## 2015-02-23 DIAGNOSIS — E039 Hypothyroidism, unspecified: Secondary | ICD-10-CM | POA: Diagnosis present

## 2015-02-23 DIAGNOSIS — E785 Hyperlipidemia, unspecified: Secondary | ICD-10-CM | POA: Diagnosis present

## 2015-02-23 DIAGNOSIS — I10 Essential (primary) hypertension: Secondary | ICD-10-CM | POA: Diagnosis present

## 2015-02-23 DIAGNOSIS — Z90710 Acquired absence of both cervix and uterus: Secondary | ICD-10-CM | POA: Diagnosis not present

## 2015-02-23 DIAGNOSIS — I2510 Atherosclerotic heart disease of native coronary artery without angina pectoris: Secondary | ICD-10-CM | POA: Diagnosis present

## 2015-02-23 DIAGNOSIS — F419 Anxiety disorder, unspecified: Secondary | ICD-10-CM | POA: Diagnosis present

## 2015-02-23 DIAGNOSIS — M48061 Spinal stenosis, lumbar region without neurogenic claudication: Secondary | ICD-10-CM | POA: Diagnosis present

## 2015-02-23 DIAGNOSIS — M4806 Spinal stenosis, lumbar region: Secondary | ICD-10-CM | POA: Diagnosis present

## 2015-02-23 DIAGNOSIS — Z79899 Other long term (current) drug therapy: Secondary | ICD-10-CM | POA: Diagnosis not present

## 2015-02-23 HISTORY — PX: POSTERIOR LUMBAR FUSION: SHX6036

## 2015-02-23 SURGERY — POSTERIOR LUMBAR FUSION 1 LEVEL
Anesthesia: General | Site: Back

## 2015-02-23 MED ORDER — PROPOFOL 10 MG/ML IV BOLUS
INTRAVENOUS | Status: AC
  Filled 2015-02-23: qty 20

## 2015-02-23 MED ORDER — SERTRALINE HCL 50 MG PO TABS: 50.0000 mg | ORAL_TABLET | Freq: Every day | ORAL | Status: DC

## 2015-02-23 MED ORDER — THROMBIN 20000 UNITS EX SOLR
CUTANEOUS | Status: DC | PRN
  Administered 2015-02-23: 09:00:00 via TOPICAL

## 2015-02-23 MED ORDER — HYDROMORPHONE HCL 1 MG/ML IJ SOLN
INTRAMUSCULAR | Status: AC
  Filled 2015-02-23: qty 1

## 2015-02-23 MED ORDER — FENTANYL CITRATE (PF) 250 MCG/5ML IJ SOLN
INTRAMUSCULAR | Status: AC
  Filled 2015-02-23: qty 5

## 2015-02-23 MED ORDER — SODIUM CHLORIDE 0.9 % IR SOLN
Status: DC | PRN
  Administered 2015-02-23: 09:00:00

## 2015-02-23 MED ORDER — ASPIRIN EC 325 MG PO TBEC
325.0000 mg | DELAYED_RELEASE_TABLET | Freq: Every day | ORAL | Status: DC
  Administered 2015-02-24 – 2015-02-28 (×5): 325 mg via ORAL
  Filled 2015-02-23 (×5): qty 1

## 2015-02-23 MED ORDER — ACETAMINOPHEN 650 MG RE SUPP: 650.0000 mg | RECTAL | Status: DC | PRN

## 2015-02-23 MED ORDER — MENTHOL 3 MG MT LOZG: 1.0000 | LOZENGE | OROMUCOSAL | Status: DC | PRN

## 2015-02-23 MED ORDER — PROPOFOL 10 MG/ML IV BOLUS
INTRAVENOUS | Status: DC | PRN
  Administered 2015-02-23: 150 mg via INTRAVENOUS

## 2015-02-23 MED ORDER — SODIUM CHLORIDE 0.9 % IJ SOLN
3.0000 mL | Freq: Two times a day (BID) | INTRAMUSCULAR | Status: DC
  Administered 2015-02-24 – 2015-02-28 (×7): 3 mL via INTRAVENOUS

## 2015-02-23 MED ORDER — FENTANYL CITRATE (PF) 100 MCG/2ML IJ SOLN
INTRAMUSCULAR | Status: DC | PRN
  Administered 2015-02-23: 100 ug via INTRAVENOUS

## 2015-02-23 MED ORDER — METOPROLOL SUCCINATE ER 50 MG PO TB24
50.0000 mg | ORAL_TABLET | Freq: Once | ORAL | Status: AC
  Administered 2015-02-23: 50 mg via ORAL
  Filled 2015-02-23: qty 1

## 2015-02-23 MED ORDER — EPHEDRINE SULFATE 50 MG/ML IJ SOLN
INTRAMUSCULAR | Status: DC | PRN
  Administered 2015-02-23 (×2): 10 mg via INTRAVENOUS
  Administered 2015-02-23 (×2): 5 mg via INTRAVENOUS
  Administered 2015-02-23: 10 mg via INTRAVENOUS

## 2015-02-23 MED ORDER — ARTIFICIAL TEARS OP OINT
TOPICAL_OINTMENT | OPHTHALMIC | Status: DC | PRN
  Administered 2015-02-23: 1 via OPHTHALMIC

## 2015-02-23 MED ORDER — PROMETHAZINE HCL 25 MG/ML IJ SOLN: 6.2500 mg | INTRAMUSCULAR | Status: DC | PRN

## 2015-02-23 MED ORDER — DOCUSATE SODIUM 100 MG PO CAPS
100.0000 mg | ORAL_CAPSULE | Freq: Two times a day (BID) | ORAL | Status: DC
  Administered 2015-02-23 – 2015-02-28 (×8): 100 mg via ORAL
  Filled 2015-02-23 (×9): qty 1

## 2015-02-23 MED ORDER — PHENYLEPHRINE HCL 10 MG/ML IJ SOLN
INTRAMUSCULAR | Status: DC | PRN
  Administered 2015-02-23: 40 ug via INTRAVENOUS
  Administered 2015-02-23 (×5): 80 ug via INTRAVENOUS

## 2015-02-23 MED ORDER — SERTRALINE HCL 50 MG PO TABS
50.0000 mg | ORAL_TABLET | Freq: Every day | ORAL | Status: DC
  Administered 2015-02-23 – 2015-02-27 (×5): 50 mg via ORAL
  Filled 2015-02-23 (×5): qty 1

## 2015-02-23 MED ORDER — LACTATED RINGERS IV SOLN
INTRAVENOUS | Status: DC | PRN
  Administered 2015-02-23 (×3): via INTRAVENOUS

## 2015-02-23 MED ORDER — CEFAZOLIN SODIUM-DEXTROSE 2-3 GM-% IV SOLR
2.0000 g | Freq: Three times a day (TID) | INTRAVENOUS | Status: AC
  Administered 2015-02-23 – 2015-02-24 (×2): 2 g via INTRAVENOUS
  Filled 2015-02-23 (×2): qty 50

## 2015-02-23 MED ORDER — MIDAZOLAM HCL 5 MG/5ML IJ SOLN
INTRAMUSCULAR | Status: DC | PRN
  Administered 2015-02-23: 2 mg via INTRAVENOUS

## 2015-02-23 MED ORDER — NEOSTIGMINE METHYLSULFATE 10 MG/10ML IV SOLN
INTRAVENOUS | Status: DC | PRN
  Administered 2015-02-23: 3 mg via INTRAVENOUS

## 2015-02-23 MED ORDER — ONDANSETRON HCL 4 MG/2ML IJ SOLN: 4.0000 mg | INTRAMUSCULAR | Status: DC | PRN

## 2015-02-23 MED ORDER — VANCOMYCIN HCL 1000 MG IV SOLR
INTRAVENOUS | Status: DC | PRN
  Administered 2015-02-23: 1000 mg via TOPICAL

## 2015-02-23 MED ORDER — GLYCOPYRROLATE 0.2 MG/ML IJ SOLN
INTRAMUSCULAR | Status: DC | PRN
  Administered 2015-02-23: .4 mg via INTRAVENOUS

## 2015-02-23 MED ORDER — VANCOMYCIN HCL 1000 MG IV SOLR
INTRAVENOUS | Status: AC
  Filled 2015-02-23: qty 1000

## 2015-02-23 MED ORDER — BUPIVACAINE LIPOSOME 1.3 % IJ SUSP
20.0000 mL | Freq: Once | INTRAMUSCULAR | Status: DC
  Filled 2015-02-23: qty 20

## 2015-02-23 MED ORDER — LISINOPRIL 20 MG PO TABS
20.0000 mg | ORAL_TABLET | Freq: Every day | ORAL | Status: DC
  Administered 2015-02-24 – 2015-02-27 (×2): 20 mg via ORAL
  Filled 2015-02-23 (×5): qty 1

## 2015-02-23 MED ORDER — CYCLOBENZAPRINE HCL 10 MG PO TABS
10.0000 mg | ORAL_TABLET | Freq: Three times a day (TID) | ORAL | Status: DC | PRN
Start: 2015-02-23 — End: 2015-02-28
  Administered 2015-02-23 – 2015-02-28 (×7): 10 mg via ORAL
  Filled 2015-02-23 (×7): qty 1

## 2015-02-23 MED ORDER — 0.9 % SODIUM CHLORIDE (POUR BTL) OPTIME
TOPICAL | Status: DC | PRN
  Administered 2015-02-23: 1000 mL

## 2015-02-23 MED ORDER — MIDAZOLAM HCL 2 MG/2ML IJ SOLN
INTRAMUSCULAR | Status: AC
  Filled 2015-02-23: qty 4

## 2015-02-23 MED ORDER — OXYCODONE-ACETAMINOPHEN 5-325 MG PO TABS
1.0000 | ORAL_TABLET | ORAL | Status: DC | PRN
  Administered 2015-02-24: 2 via ORAL
  Administered 2015-02-25 (×2): 1 via ORAL
  Administered 2015-02-25: 2 via ORAL
  Administered 2015-02-26 (×5): 1 via ORAL
  Administered 2015-02-27: 2 via ORAL
  Administered 2015-02-27: 1 via ORAL
  Administered 2015-02-28: 2 via ORAL
  Administered 2015-02-28: 1 via ORAL
  Administered 2015-02-28: 2 via ORAL
  Filled 2015-02-23: qty 2
  Filled 2015-02-23: qty 1
  Filled 2015-02-23: qty 2
  Filled 2015-02-23 (×3): qty 1
  Filled 2015-02-23: qty 2
  Filled 2015-02-23: qty 1
  Filled 2015-02-23: qty 2
  Filled 2015-02-23 (×2): qty 1
  Filled 2015-02-23: qty 2
  Filled 2015-02-23 (×3): qty 1

## 2015-02-23 MED ORDER — PRAVASTATIN SODIUM 20 MG PO TABS
20.0000 mg | ORAL_TABLET | Freq: Every day | ORAL | Status: DC
  Administered 2015-02-23 – 2015-02-27 (×5): 20 mg via ORAL
  Filled 2015-02-23 (×5): qty 1

## 2015-02-23 MED ORDER — EZETIMIBE 10 MG PO TABS
10.0000 mg | ORAL_TABLET | Freq: Every day | ORAL | Status: DC
  Administered 2015-02-24 – 2015-02-28 (×5): 10 mg via ORAL
  Filled 2015-02-23 (×5): qty 1

## 2015-02-23 MED ORDER — HEMOSTATIC AGENTS (NO CHARGE) OPTIME
TOPICAL | Status: DC | PRN
  Administered 2015-02-23: 1 via TOPICAL

## 2015-02-23 MED ORDER — HYDROMORPHONE HCL 1 MG/ML IJ SOLN
0.2500 mg | INTRAMUSCULAR | Status: DC | PRN
  Administered 2015-02-23 (×2): 0.5 mg via INTRAVENOUS

## 2015-02-23 MED ORDER — ACETAMINOPHEN 325 MG PO TABS
650.0000 mg | ORAL_TABLET | ORAL | Status: DC | PRN
  Administered 2015-02-25 – 2015-02-28 (×3): 650 mg via ORAL
  Filled 2015-02-23 (×3): qty 2

## 2015-02-23 MED ORDER — BUPIVACAINE LIPOSOME 1.3 % IJ SUSP
INTRAMUSCULAR | Status: DC | PRN
  Administered 2015-02-23: 20 mL

## 2015-02-23 MED ORDER — MEPERIDINE HCL 25 MG/ML IJ SOLN: 6.2500 mg | INTRAMUSCULAR | Status: DC | PRN

## 2015-02-23 MED ORDER — SODIUM CHLORIDE 0.9 % IJ SOLN: 3.0000 mL | INTRAMUSCULAR | Status: DC | PRN

## 2015-02-23 MED ORDER — SODIUM CHLORIDE 0.9 % IV SOLN
250.0000 mL | INTRAVENOUS | Status: DC
  Administered 2015-02-24: 250 mL via INTRAVENOUS

## 2015-02-23 MED ORDER — METOPROLOL SUCCINATE ER 25 MG PO TB24
50.0000 mg | ORAL_TABLET | Freq: Every day | ORAL | Status: DC
  Administered 2015-02-24 – 2015-02-27 (×2): 50 mg via ORAL
  Filled 2015-02-23 (×5): qty 2

## 2015-02-23 MED ORDER — ONDANSETRON HCL 4 MG/2ML IJ SOLN
INTRAMUSCULAR | Status: DC | PRN
  Administered 2015-02-23: 4 mg via INTRAVENOUS

## 2015-02-23 MED ORDER — ALUM & MAG HYDROXIDE-SIMETH 200-200-20 MG/5ML PO SUSP: 30.0000 mL | Freq: Four times a day (QID) | ORAL | Status: DC | PRN

## 2015-02-23 MED ORDER — LIDOCAINE-EPINEPHRINE 1 %-1:100000 IJ SOLN
INTRAMUSCULAR | Status: DC | PRN
  Administered 2015-02-23: 10 mL

## 2015-02-23 MED ORDER — PHENOL 1.4 % MT LIQD
1.0000 | OROMUCOSAL | Status: DC | PRN
Start: 2015-02-23 — End: 2015-02-28

## 2015-02-23 MED ORDER — HYDROMORPHONE HCL 1 MG/ML IJ SOLN
0.5000 mg | INTRAMUSCULAR | Status: DC | PRN
  Administered 2015-02-23 – 2015-02-24 (×7): 1 mg via INTRAVENOUS
  Filled 2015-02-23 (×8): qty 1

## 2015-02-23 MED ORDER — NITROGLYCERIN 0.4 MG SL SUBL: 0.4000 mg | SUBLINGUAL_TABLET | SUBLINGUAL | Status: DC | PRN

## 2015-02-23 MED ORDER — LEVOTHYROXINE SODIUM 100 MCG PO TABS
100.0000 ug | ORAL_TABLET | Freq: Every day | ORAL | Status: DC
  Administered 2015-02-24 – 2015-02-28 (×5): 100 ug via ORAL
  Filled 2015-02-23 (×5): qty 1

## 2015-02-23 MED ORDER — ROCURONIUM BROMIDE 100 MG/10ML IV SOLN
INTRAVENOUS | Status: DC | PRN
  Administered 2015-02-23: 10 mg via INTRAVENOUS
  Administered 2015-02-23: 20 mg via INTRAVENOUS
  Administered 2015-02-23: 40 mg via INTRAVENOUS

## 2015-02-23 MED ORDER — LIDOCAINE HCL (CARDIAC) 20 MG/ML IV SOLN
INTRAVENOUS | Status: DC | PRN
  Administered 2015-02-23: 50 mg via INTRAVENOUS

## 2015-02-23 SURGICAL SUPPLY — 78 items
BAG DECANTER FOR FLEXI CONT (MISCELLANEOUS) ×2 IMPLANT
BENZOIN TINCTURE PRP APPL 2/3 (GAUZE/BANDAGES/DRESSINGS) ×2 IMPLANT
BIT DRILL 5.0/4.0 (BIT) ×1 IMPLANT
BLADE CLIPPER SURG (BLADE) IMPLANT
BLADE SURG 11 STRL SS (BLADE) ×2 IMPLANT
BONE ALLOSTEM MORSELIZED 5CC (Bone Implant) ×2 IMPLANT
BRUSH SCRUB EZ PLAIN DRY (MISCELLANEOUS) ×2 IMPLANT
BUR MATCHSTICK NEURO 3.0 LAGG (BURR) ×2 IMPLANT
BUR PRECISION FLUTE 6.0 (BURR) ×2 IMPLANT
CAGE RISE 11-17-15 10X22 (Cage) ×4 IMPLANT
CANISTER SUCT 3000ML PPV (MISCELLANEOUS) ×2 IMPLANT
CAP LOCKING (Cap) ×4 IMPLANT
CAP LOCKING 5.5 CREO (Cap) ×4 IMPLANT
CONT SPEC 4OZ CLIKSEAL STRL BL (MISCELLANEOUS) ×2 IMPLANT
COVER BACK TABLE 60X90IN (DRAPES) ×2 IMPLANT
DECANTER SPIKE VIAL GLASS SM (MISCELLANEOUS) ×2 IMPLANT
DERMABOND ADHESIVE PROPEN (GAUZE/BANDAGES/DRESSINGS) ×1
DERMABOND ADVANCED .7 DNX6 (GAUZE/BANDAGES/DRESSINGS) ×1 IMPLANT
DRAPE C-ARM 42X72 X-RAY (DRAPES) ×2 IMPLANT
DRAPE C-ARMOR (DRAPES) ×4 IMPLANT
DRAPE LAPAROTOMY 100X72X124 (DRAPES) ×2 IMPLANT
DRAPE POUCH INSTRU U-SHP 10X18 (DRAPES) ×4 IMPLANT
DRAPE PROXIMA HALF (DRAPES) IMPLANT
DRAPE SURG 17X23 STRL (DRAPES) ×2 IMPLANT
DRILL 5.0/4.0 (BIT) ×2
DRSG OPSITE POSTOP 4X6 (GAUZE/BANDAGES/DRESSINGS) ×2 IMPLANT
DURAPREP 26ML APPLICATOR (WOUND CARE) ×2 IMPLANT
DURASEAL APPLICATOR TIP (TIP) ×2 IMPLANT
DURASEAL SPINE SEALANT 3ML (MISCELLANEOUS) ×2 IMPLANT
ELECT REM PT RETURN 9FT ADLT (ELECTROSURGICAL) ×2
ELECTRODE REM PT RTRN 9FT ADLT (ELECTROSURGICAL) ×1 IMPLANT
EVACUATOR 3/16  PVC DRAIN (DRAIN)
EVACUATOR 3/16 PVC DRAIN (DRAIN) IMPLANT
GAUZE SPONGE 4X4 12PLY STRL (GAUZE/BANDAGES/DRESSINGS) IMPLANT
GAUZE SPONGE 4X4 16PLY XRAY LF (GAUZE/BANDAGES/DRESSINGS) ×2 IMPLANT
GLOVE BIO SURGEON STRL SZ8 (GLOVE) ×6 IMPLANT
GLOVE BIOGEL PI IND STRL 6.5 (GLOVE) ×2 IMPLANT
GLOVE BIOGEL PI IND STRL 7.5 (GLOVE) ×1 IMPLANT
GLOVE BIOGEL PI IND STRL 8 (GLOVE) ×1 IMPLANT
GLOVE BIOGEL PI INDICATOR 6.5 (GLOVE) ×2
GLOVE BIOGEL PI INDICATOR 7.5 (GLOVE) ×1
GLOVE BIOGEL PI INDICATOR 8 (GLOVE) ×1
GLOVE ECLIPSE 7.5 STRL STRAW (GLOVE) ×8 IMPLANT
GLOVE EXAM NITRILE LRG STRL (GLOVE) IMPLANT
GLOVE EXAM NITRILE MD LF STRL (GLOVE) IMPLANT
GLOVE EXAM NITRILE XL STR (GLOVE) IMPLANT
GLOVE EXAM NITRILE XS STR PU (GLOVE) IMPLANT
GLOVE INDICATOR 8.5 STRL (GLOVE) ×4 IMPLANT
GOWN STRL REUS W/ TWL LRG LVL3 (GOWN DISPOSABLE) ×1 IMPLANT
GOWN STRL REUS W/ TWL XL LVL3 (GOWN DISPOSABLE) ×3 IMPLANT
GOWN STRL REUS W/TWL 2XL LVL3 (GOWN DISPOSABLE) ×2 IMPLANT
GOWN STRL REUS W/TWL LRG LVL3 (GOWN DISPOSABLE) ×1
GOWN STRL REUS W/TWL XL LVL3 (GOWN DISPOSABLE) ×3
KIT BASIN OR (CUSTOM PROCEDURE TRAY) ×2 IMPLANT
KIT ROOM TURNOVER OR (KITS) ×2 IMPLANT
LIQUID BAND (GAUZE/BANDAGES/DRESSINGS) ×2 IMPLANT
NEEDLE HYPO 21X1.5 SAFETY (NEEDLE) ×2 IMPLANT
NEEDLE HYPO 25X1 1.5 SAFETY (NEEDLE) IMPLANT
NS IRRIG 1000ML POUR BTL (IV SOLUTION) ×2 IMPLANT
PACK LAMINECTOMY NEURO (CUSTOM PROCEDURE TRAY) ×2 IMPLANT
PAD ARMBOARD 7.5X6 YLW CONV (MISCELLANEOUS) ×6 IMPLANT
ROD 40MM SPINAL (Rod) ×4 IMPLANT
SCREW MOD 6.0-5.0X35MM (Screw) ×4 IMPLANT
SCREW PREASSEMBLY 6.0-5.0X35 (Screw) ×4 IMPLANT
SPONGE LAP 4X18 X RAY DECT (DISPOSABLE) IMPLANT
SPONGE SURGIFOAM ABS GEL 100 (HEMOSTASIS) ×2 IMPLANT
STRIP CLOSURE SKIN 1/2X4 (GAUZE/BANDAGES/DRESSINGS) ×2 IMPLANT
SUT PROLENE 6 0 BV (SUTURE) ×2 IMPLANT
SUT VIC AB 0 CT1 18XCR BRD8 (SUTURE) ×2 IMPLANT
SUT VIC AB 0 CT1 8-18 (SUTURE) ×2
SUT VIC AB 2-0 CT1 18 (SUTURE) ×2 IMPLANT
SUT VIC AB 4-0 PS2 27 (SUTURE) ×2 IMPLANT
SYR 20CC LL (SYRINGE) ×2 IMPLANT
TOWEL OR 17X24 6PK STRL BLUE (TOWEL DISPOSABLE) ×2 IMPLANT
TOWEL OR 17X26 10 PK STRL BLUE (TOWEL DISPOSABLE) ×2 IMPLANT
TRAY FOLEY W/METER SILVER 14FR (SET/KITS/TRAYS/PACK) ×2 IMPLANT
TULIP CREP AMP 5.5MM (Orthopedic Implant) ×4 IMPLANT
WATER STERILE IRR 1000ML POUR (IV SOLUTION) ×2 IMPLANT

## 2015-02-23 NOTE — Anesthesia Procedure Notes (Signed)
Procedure Name: Intubation Date/Time: 02/23/2015 8:34 AM Performed by: Neldon [place] Pre-anesthesia Checklist: Patient being monitored, Suction available, Emergency Drugs available, Patient identified and Timeout performed Patient Re-evaluated:Patient Re-evaluated prior to inductionOxygen Delivery Method: Circle system utilized Preoxygenation: Pre-oxygenation with 100% oxygen Intubation Type: IV induction Ventilation: Mask ventilation without difficulty Laryngoscope Size: Mac and 3 Grade View: Grade I Tube type: Oral Tube size: 7.0 mm Number of attempts: 2 Placement Confirmation: positive ETCO2,  ETT inserted through vocal cords under direct vision and breath sounds checked- equal and bilateral Secured at: 22 cm Tube secured with: Tape Dental Injury: Teeth and Oropharynx as per pre-operative assessment

## 2015-02-23 NOTE — H&P (Signed)
Cindy [name] is an 59 y.o. female.   Chief Complaint: Back and right leg pain HPI: Patient is a very pleasant 59 year old female suffers worsening back and right leg pain rating down back of her leg outside of her foot and big toe consistent with an L5 nerve root pattern. Workup revealed severe spinal stenosis grade 1 spinal listhesis at L4-5 and marked foraminal stenosis. She had movement on flexion extension films and failed all forms of conservative treatment. I recommended stabilization decompression procedure since over the risks and benefits of the operation with the patient as well as perioperative course expectations of outcome and alternatives of surgery and she understands and agrees to proceed forward.  Past Medical History  Diagnosis Date  . Coronary artery disease     s/p PCI in 2006, treated with bare metal stent (3.0x51mm Driver placed in diagonal branch);  cath 8/12: mDx stent ok with 20% ISR, EF 65%;  ETT-Myoview 8/14:  Low risk, EF 80%, anterior defect likely represents diaphragmatic atten, normal wall motion  . Hypertension   . Dyslipidemia   . Osteoarthritis     Shoulder and hip  . Hypothyroidism   . Arthritis   . Uterine cancer ([place])   . Anemia   . Hyperlipidemia   . Anginal pain ([place])   . Pneumonia   . Depression   . Anxiety   . GERD (gastroesophageal reflux disease)   . Dysrhythmia     tachycardia if she misses Toprol    Past Surgical History  Procedure Laterality Date  . Total abdominal hysterectomy w/ bilateral salpingoophorectomy  2008  . Tonsillectomy and adenoidectomy    . Cardiac catheterization    . Colonoscopy    . Upper gastrointestinal endoscopy    . Abdominal hysterectomy      Family History  Problem Relation Age of Onset  . Heart attack Mother 72  . Diabetes Mother   . Heart disease Maternal Grandmother   . Colon cancer Neg Hx    Social History:  reports that she has never smoked. She has never used smokeless tobacco. She reports that she  drinks alcohol. She reports that she does not use illicit drugs.  Allergies:  Allergies  Allergen Reactions  . Morphine And Related Itching    Medications Prior to Admission  Medication Sig Dispense Refill  . aspirin EC 325 MG tablet Take 325 mg by mouth daily.      Marland Kitchen ezetimibe (ZETIA) 10 MG tablet Take 1 tablet (10 mg total) by mouth daily. 90 tablet 3  . levothyroxine (SYNTHROID, LEVOTHROID) 100 MCG tablet Take 100 mcg by mouth daily.      Marland Kitchen lisinopril (PRINIVIL,ZESTRIL) 20 MG tablet Take 1 tablet (20 mg total) by mouth daily. 90 tablet 3  . metoprolol (TOPROL-XL) 50 MG 24 hr tablet Take 1 tablet (50 mg total) by mouth daily. 90 tablet 3  . nitroGLYCERIN (NITROSTAT) 0.4 MG SL tablet Place 1 tablet (0.4 mg total) under the tongue every 5 (five) minutes as needed for chest pain. 25 tablet 6  . Pitavastatin Calcium (LIVALO) 4 MG TABS Take 4 mg by mouth daily.    . sertraline (ZOLOFT) 50 MG tablet Take 50 mg by mouth daily.  6    No results found for this or any previous visit (from the past 48 hour(s)). No results found.  Review of Systems  Constitutional: Negative.   HENT: Negative.   Eyes: Negative.   Respiratory: Negative.   Cardiovascular: Negative.   Gastrointestinal: Negative.  Genitourinary: Negative.   Musculoskeletal: Positive for myalgias and back pain.  Skin: Negative.   Neurological: Positive for tingling and sensory change.  Endo/Heme/Allergies: Negative.   Psychiatric/Behavioral: Negative.     Blood pressure 159/80, pulse 69, temperature 97.2 F (36.2 C), temperature source Oral, resp. rate 18, weight 79.561 kg (175 lb 6.4 oz), SpO2 99 %. Physical Exam  Constitutional: She is oriented to person, place, and time. She appears well-developed and well-nourished.  Eyes: Pupils are equal, round, and reactive to light.  Neck: Normal range of motion.  Respiratory: Effort normal.  GI: Soft. Bowel sounds are normal.  Neurological: She is alert and oriented to person,  place, and time. She has normal strength.  Is 5 out of 5 wound out of 5 iliopsoas quads and she's gastrocs EHL  Skin: Skin is warm and dry.     Assessment/Plan Patient was inserted decompressive position procedure at L4-5.  Saban Heinlen P 02/23/2015, 8:21 AM

## 2015-02-23 NOTE — Anesthesia Postprocedure Evaluation (Signed)
Anesthesia Post Note  Patient: [facility]  Procedure(s) Performed: Procedure(s) (LRB): Lumbar four-five Posterior Lumbar Interbody Fusion  (N/A)  Anesthesia type: General  Patient location: PACU  Post pain: Pain level controlled  Post assessment: Post-op Vital signs reviewed  Last Vitals: BP 123/51 mmHg  Pulse 68  Temp(Src) 36.7 C (Oral)  Resp 14  Wt 175 lb 6.4 oz (79.561 kg)  SpO2 100%  Post vital signs: Reviewed  Level of consciousness: sedated  Complications: No apparent anesthesia complications

## 2015-02-23 NOTE — Op Note (Signed)
Preoperative diagnosis: Grade 1 spondylolisthesis L4-5 and severe lumbar spinal stenosis L4-5 with bilateral L4 and L5 radiculopathies  Postoperative diagnosis: Same  Procedure: Decompressive lumbar laminectomy L4-5 complete medial facetectomies and radical foraminotomies of the L4 and L5 nerve roots in excess and requiring more work to would be needed with a standard interbody fusion  #2 posterior lumbar interbody fusion using the globus rise expandable cage system packed with locally harvested autograft mixed with allostem  #3 cortical screw fixation L4-5 using the globus Creo modular cortical screw set with 35 mm 6050 screws  Surgeon: Dominica Severin Arthelia Callicott  Asst.: Sherley Bounds  Anesthesia: Gen.  EBL: Minimal  History of present illness: Patient is very pleasant 59 year old female who suffers worsening back and bilateral leg pain worse in the right radiating down L4 and L5 distribution. Workup revealed grade 1 spondylolisthesis movement on flexion extension severe spinal stenosis and L4 and L5 foraminal stenosis. Patient failed all forms of conservative treatment. Due to progressive clinical syndrome imaging findings and failure conservative treatment I recommended decompression stabilization procedure at L4-5. I extensively went over the risks and benefits of the operation with the patient as well as perioperative course expectations of outcome and alternatives of surgery and she understood and agreed to proceed forward.  Operative procedure: Patient brought into the or was induced under general anesthesia positioned prone on the [facility] table her back was prepped and draped in routine sterile fashion preoperative localizing appropriate level so after infiltration of 10 mL lidocaine with epi midline incision was made and Bovie left car was used to the subcutaneous tissues subperiosteal dissection was care lamina of L4 and L5 bilaterally. Using AP and lateral fluoroscopy the appropriate area was then  re-confirmed pilot holes were drilled at the inferomedial aspect both pedicles of the 5 and 7:00 position these were drilled to 30 mm probed O50 tap probed again and 6050 screws were inserted 35 mm in length at L4 bilaterally. Then the spinous process of L4 and removed central decompression was begun complete medial fasciectomies were performed there is marked stenosis and hypertrophy of them of facet complex causing severe hourglass compression of thecal sac. This is all removed radical foraminotomies were carried out in the L3 and L4 nerve roots bilaterally. There was a large osteophectomy to the medial aspect facet complex on the left side it was densely adherent to the dura and as is moving this osteophyte was a small tear in the lateral aspect of the nerve root sleeve on the dura which I repaired primarily with 6-0 Prolene. No further leaking CSF and this is packed with Gelfoam the foramen was unroofed in the nerve root was widely decompressed. The epidural veins correlated disc space was incised and cleanout radically bilaterally endplates were prepared cages were inserted sequentially with local autograft mixed centrally I selected [id number] expanded will cages were packed with local autograft mixed. The mix was packed centrally. Then the wound scope was irrigated I placed the L5 cortical screws all screws excellent purchase rods were then placed top tightness tightened down the wound scope was irrigated meticulous in space was maintained there is a was overlaid top of the CSF leak as well as Gelfoam and with DuraSeal the wound was then reapproximated with interrupted Vicryl running 4 subcuticular Dermabond Steritapes benzoin and sterile dressing. Case all needle counts sponge counts were correct.

## 2015-02-23 NOTE — Anesthesia Preprocedure Evaluation (Addendum)
Anesthesia Evaluation  Patient identified by MRN, date of birth, ID band Patient awake    Reviewed: Allergy & Precautions, NPO status , Patient's Chart, lab work & pertinent test results  Airway Mallampati: II  TM Distance: >3 FB Neck ROM: Full    Dental no notable dental hx. (+) Teeth Intact, Dental Advidsory Given   Pulmonary pneumonia,    Pulmonary exam normal breath sounds clear to auscultation       Cardiovascular hypertension, On Home Beta Blockers (-) angina+ CAD  Normal cardiovascular exam+ dysrhythmias  Rhythm:Regular Rate:Normal     Neuro/Psych PSYCHIATRIC DISORDERS Anxiety Depression negative neurological ROS     GI/Hepatic Neg liver ROS, GERD  Medicated and Controlled,  Endo/Other  Hypothyroidism   Renal/GU negative Renal ROS     Musculoskeletal  (+) Arthritis ,   Abdominal   Peds  Hematology  (+) Blood dyscrasia, anemia ,   Anesthesia Other Findings   Reproductive/Obstetrics negative OB ROS                           Anesthesia Physical Anesthesia Plan  ASA: II  Anesthesia Plan: General   Post-op Pain Management:    Induction: Intravenous  Airway Management Planned: Oral ETT  Additional Equipment:   Intra-op Plan:   Post-operative Plan: Extubation in OR  Informed Consent: I have reviewed the patients History and Physical, chart, labs and discussed the procedure including the risks, benefits and alternatives for the proposed anesthesia with the patient or authorized representative who has indicated his/her understanding and acceptance.   Dental advisory given and Dental Advisory Given  Plan Discussed with: CRNA, Anesthesiologist and Surgeon  Anesthesia Plan Comments:       Anesthesia Quick Evaluation

## 2015-02-23 NOTE — Transfer of Care (Signed)
Immediate Anesthesia Transfer of Care Note  Patient: [facility]  Procedure(s) Performed: Procedure(s): Lumbar four-five Posterior Lumbar Interbody Fusion  (N/A)  Patient Location: PACU  Anesthesia Type:General  Level of Consciousness: awake, alert  and oriented  Airway & Oxygen Therapy: Patient Spontanous Breathing and Patient connected to face mask oxygen  Post-op Assessment: Report given to RN, Post -op Vital signs reviewed and stable and Patient moving all extremities X 4  Post vital signs: Reviewed and stable  Last Vitals:  Filed Vitals:   02/23/15 0733  BP: 159/80  Pulse: 69  Temp: 36.2 C  Resp: 18    Complications: No apparent anesthesia complications

## 2015-02-24 MED ORDER — PANTOPRAZOLE SODIUM 40 MG PO TBEC
40.0000 mg | DELAYED_RELEASE_TABLET | Freq: Two times a day (BID) | ORAL | Status: DC
  Administered 2015-02-24 – 2015-02-28 (×9): 40 mg via ORAL
  Filled 2015-02-24 (×9): qty 1

## 2015-02-24 MED ORDER — DEXAMETHASONE SODIUM PHOSPHATE 10 MG/ML IJ SOLN
10.0000 mg | Freq: Four times a day (QID) | INTRAMUSCULAR | Status: AC
  Administered 2015-02-24 (×2): 10 mg via INTRAVENOUS
  Filled 2015-02-24 (×2): qty 1

## 2015-02-24 NOTE — Progress Notes (Signed)
Utilization review completed. Wilhemenia Camba, RN, BSN. 

## 2015-02-24 NOTE — Progress Notes (Signed)
Subjective: Patient reports Doing well condition of back pain but no leg pain some numbness in the top of her foot.  Objective: Vital signs in last 24 hours: Temp:  [97 F (36.1 C)-98.2 F (36.8 C)] 98 F (36.7 C) (11/03 0532) Pulse Rate:  [61-80] 73 (11/03 0532) Resp:  [9-29] 15 (11/03 0532) BP: (95-159)/(51-82) 95/61 mmHg (11/03 0532) SpO2:  [94 %-100 %] 100 % (11/03 0532)  Intake/Output from previous day: 11/02 0701 - 11/03 0700 In: 2200 [I.V.:2200] Out: 4025 [Urine:3625; Blood:400] Intake/Output this shift:    strength out of 5 wound clean dry and intact decreased sensation L5 distribution left foot  Lab Results: No results for input(s): WBC, HGB, HCT, PLT in the last 72 hours. BMET No results for input(s): NA, K, CL, CO2, GLUCOSE, BUN, CREATININE, CALCIUM in the last 72 hours.  Studies/Results: Dg Lumbar Spine 2-3 Views  02/23/2015  CLINICAL DATA:  Status post L4-5 PLIF EXAM: DG C-ARM 61-120 MIN; LUMBAR SPINE - 2-3 VIEW COMPARISON:  May 18, 2014 lumbar spine series FINDINGS: Fluoro time reported is 1 minutes, 18 seconds. Two fluoro spot images reveal pedicle screws bilaterally at L4 and L5. An inter discal device is present. Rods connecting the pedicle screws have not yet been placed. IMPRESSION: Intraoperative images from a PLIF procedure at L4-L5 without evidence of immediate complication. Electronically Signed   By: David  Martinique M.D.   On: 02/23/2015 11:34   Dg C-arm 1-60 Min  02/23/2015  CLINICAL DATA:  Status post L4-5 PLIF EXAM: DG C-ARM 61-120 MIN; LUMBAR SPINE - 2-3 VIEW COMPARISON:  May 18, 2014 lumbar spine series FINDINGS: Fluoro time reported is 1 minutes, 18 seconds. Two fluoro spot images reveal pedicle screws bilaterally at L4 and L5. An inter discal device is present. Rods connecting the pedicle screws have not yet been placed. IMPRESSION: Intraoperative images from a PLIF procedure at L4-L5 without evidence of immediate complication. Electronically  Signed   By: David  Martinique M.D.   On: 02/23/2015 11:34    Assessment/Plan: 59 year old female postop day 1 from an L4-5 left currently on bedrest will maintain on bed rest for 24 more hours and then the will mobilize in the morning. We'll give a couple doses of Decadron for the numbness.  LOS: 1 day     Leontae Bostock P 02/24/2015, 7:32 AM

## 2015-02-24 NOTE — Care Management Note (Signed)
Case Management Note  Patient Details  Name: Cindy [name] MRN: [id number] Date of Birth: 1956/03/25  Subjective/Objective:                    Action/Plan: Patient was admitted for a PLIF. Lives at home with spouse. Will follow for discharge needs pending PT/OT evals and physician orders.  Expected Discharge Date:                  Expected Discharge Plan:     In-House Referral:     Discharge planning Services     Post Acute Care Choice:    Choice offered to:     DME Arranged:    DME Agency:     HH Arranged:    HH Agency:     Status of Service:  In process, will continue to follow  Medicare Important Message Given:    Date Medicare IM Given:    Medicare IM give by:    Date Additional Medicare IM Given:    Additional Medicare Important Message give by:     If discussed at [place] of Stay Meetings, dates discussed:    Additional Comments:  Rolm Baptise, RN 02/24/2015, 3:59 PM

## 2015-02-25 MED ORDER — SODIUM CHLORIDE 0.9 % IV SOLN
Freq: Once | INTRAVENOUS | Status: AC
  Administered 2015-02-25: 16:00:00 via INTRAVENOUS

## 2015-02-25 MED ORDER — ZOLPIDEM TARTRATE 5 MG PO TABS
5.0000 mg | ORAL_TABLET | Freq: Every evening | ORAL | Status: DC | PRN
  Administered 2015-02-25 – 2015-02-26 (×2): 5 mg via ORAL
  Filled 2015-02-25 (×2): qty 1

## 2015-02-25 NOTE — Evaluation (Signed)
Physical Therapy Evaluation Patient Details Name: Cindy [name] MRN: [id number] DOB: 25-Sep-1955 Today's Date: 02/25/2015   History of Present Illness  Patient is a 59 y/o female s/p L4-5 PLIF. PMH includes CAD, HTN, HLD, depression, anxiety.    Clinical Impression  Patient presents with pain, hypotension, impaired sensation left foot and weakness LLE s/p above surgery impacting mobility. Tolerated ambulation and transfers with Min A for safety. Pt became hypotensive sitting in chair resulting in need to return back to supine. See vitals below. Rn present and aware. Education on back precautions. Will follow acutely to maximize independence and mobility prior to return home.     Follow Up Recommendations Home health PT;Supervision for mobility/OOB    Equipment Recommendations  Rolling walker with 5" wheels (Family to see if they can borrow one)    Recommendations for Other Services       Precautions / Restrictions Precautions Precautions: Back Precaution Booklet Issued: No Precaution Comments: Reviewed back precautions. Required Braces or Orthoses: Spinal Brace Spinal Brace: Lumbar corset;Applied in sitting position Restrictions Weight Bearing Restrictions: No      Mobility  Bed Mobility Overal bed mobility: Needs Assistance Bed Mobility: Rolling;Sidelying to Sit;Sit to Sidelying Rolling: Min guard Sidelying to sit: Mod assist     Sit to sidelying: Mod assist General bed mobility comments: Cues for log roll technique. Mod A to elevate trunk. Assist with BLEs into bed.   Transfers Overall transfer level: Needs assistance Equipment used: Rolling walker (2 wheeled) Transfers: Sit to/from Stand Sit to Stand: Min assist;Min guard         General transfer comment: Min A to boost from EOB x1, from toilet x1, from chair x1, from [facility] x1. Cues for hand placement/technique. Transferred to chair.   Ambulation/Gait Ambulation/Gait assistance: Min assist Ambulation Distance  (Feet): 100 Feet Assistive device: Rolling walker (2 wheeled) Gait Pattern/deviations: Step-through pattern;Decreased stride length;Decreased weight shift to left   Gait velocity interpretation: <1.8 ft/sec, indicative of risk for recurrent falls General Gait Details: SLow, mildly unsteady gait with left ankle/knee instability noted.   Stairs            Wheelchair Mobility    Modified Rankin (Stroke Patients Only)       Balance Overall balance assessment: Needs assistance Sitting-balance support: Feet supported;No upper extremity supported Sitting balance-Leahy Scale: Fair     Standing balance support: During functional activity Standing balance-Leahy Scale: Poor Standing balance comment: Relient on RW for support. LOB with retro gait.                             Pertinent Vitals/Pain Pain Assessment: 0-10 Pain Score: 6  Pain Location: back at surgical site Pain Descriptors / Indicators: Sore;Operative site guarding Pain Intervention(s): Monitored during session;Repositioned;RN gave pain meds during session    [place] expects to be discharged to:: Private residence Living Arrangements: Spouse/significant other Available Help at Discharge: Family Type of Home: House Home Access: Stairs to enter Entrance Stairs-Rails: None Entrance Stairs-Number of Steps: 3 wide steps Home Layout: One level Home Equipment: None      Prior Function Level of Independence: Independent               Hand Dominance        Extremity/Trunk Assessment   Upper Extremity Assessment: Defer to OT evaluation           Lower Extremity Assessment: LLE deficits/detail   LLE Deficits / Details:  Grossly ~3+/5 throughout however instability left ankle/knee noted during functional mobility.     Communication   Communication: No difficulties  Cognition Arousal/Alertness: Awake/alert Behavior During Therapy: WFL for tasks  assessed/performed Overall Cognitive Status: Within Functional Limits for tasks assessed                      General Comments General comments (skin integrity, edema, etc.): Spouse present during session. BP sitting in chair 77/28 - pallor, reports seeing black spots; BP 87/60 post transfer to EOB. RN notified.     Exercises        Assessment/Plan    PT Assessment Patient needs continued PT services  PT Diagnosis Difficulty walking;Acute pain   PT Problem List Decreased strength;Pain;Decreased balance;Decreased mobility;Decreased knowledge of precautions;Decreased activity tolerance;Impaired sensation  PT Treatment Interventions Balance training;Gait training;Stair training;Functional mobility training;Therapeutic activities;Therapeutic exercise;Patient/family education   PT Goals (Current goals can be found in the Care Plan section) Acute Rehab PT Goals Patient Stated Goal: to get back to life PT Goal Formulation: With patient Time For Goal Achievement: 03/11/15 Potential to Achieve Goals: Fair    Frequency Min 5X/week   Barriers to discharge        Co-evaluation               End of Session Equipment Utilized During Treatment: Gait belt;Back brace Activity Tolerance: Treatment limited secondary to medical complications (Comment) (hypotension) Patient left: in bed;with call bell/phone within reach;with family/visitor present;with SCD's reapplied Nurse Communication: Mobility status         Time: 1451-1541 PT Time Calculation (min) (ACUTE ONLY): 50 min   Charges:   PT Evaluation $Initial PT Evaluation Tier I: 1 Procedure PT Treatments $Gait Training: 8-22 mins $Therapeutic Activity: 8-22 mins   PT G Codes:        Pier Bosher A Britany Callicott 02/25/2015, 3:54 PM Wray Kearns, [place], DPT [phone]

## 2015-02-25 NOTE — Progress Notes (Signed)
Subjective: Patient reports Doing well no leg pain back pain controlled  Objective: Vital signs in last 24 hours: Temp:  [97.9 F (36.6 C)-99.1 F (37.3 C)] 97.9 F (36.6 C) (11/04 0541) Pulse Rate:  [60-80] 61 (11/04 0541) Resp:  [16] 16 (11/04 0541) BP: (95-117)/(45-60) 95/46 mmHg (11/04 0541) SpO2:  [97 %-100 %] 100 % (11/04 0541)  Intake/Output from previous day: 11/03 0701 - 11/04 0700 In: -  Out: 9798 [Urine:1775] Intake/Output this shift:   strength 5 out of 5 wound clean dry and intact    Lab Results: No results for input(s): WBC, HGB, HCT, PLT in the last 72 hours. BMET No results for input(s): NA, K, CL, CO2, GLUCOSE, BUN, CREATININE, CALCIUM in the last 72 hours.  Studies/Results: Dg Lumbar Spine 2-3 Views  02/23/2015  CLINICAL DATA:  Status post L4-5 PLIF EXAM: DG C-ARM 61-120 MIN; LUMBAR SPINE - 2-3 VIEW COMPARISON:  May 18, 2014 lumbar spine series FINDINGS: Fluoro time reported is 1 minutes, 18 seconds. Two fluoro spot images reveal pedicle screws bilaterally at L4 and L5. An inter discal device is present. Rods connecting the pedicle screws have not yet been placed. IMPRESSION: Intraoperative images from a PLIF procedure at L4-L5 without evidence of immediate complication. Electronically Signed   By: David  Martinique M.D.   On: 02/23/2015 11:34   Dg C-arm 1-60 Min  02/23/2015  CLINICAL DATA:  Status post L4-5 PLIF EXAM: DG C-ARM 61-120 MIN; LUMBAR SPINE - 2-3 VIEW COMPARISON:  May 18, 2014 lumbar spine series FINDINGS: Fluoro time reported is 1 minutes, 18 seconds. Two fluoro spot images reveal pedicle screws bilaterally at L4 and L5. An inter discal device is present. Rods connecting the pedicle screws have not yet been placed. IMPRESSION: Intraoperative images from a PLIF procedure at L4-L5 without evidence of immediate complication. Electronically Signed   By: David  Martinique M.D.   On: 02/23/2015 11:34    Assessment/Plan: Slowly raise the head of her bed  and mobilize her today  LOS: 2 days     Shaasia Odle P 02/25/2015, 7:34 AM

## 2015-02-26 NOTE — Progress Notes (Signed)
Patient ID: Cindy [name], female   DOB: [birth year], 59 y.o.   MRN: [id number] Afeb, vss Slowly increasing activity. Felt light headed when she first got up. She wants to stay until tomorrow and see how she is doing then.

## 2015-02-26 NOTE — Progress Notes (Signed)
Physical Therapy Treatment Patient Details Name: Cindy [name] MRN: [id number] DOB: [birth year] Today's Date: 02/26/2015    History of Present Illness Patient is a 59 y/o female s/p L4-5 PLIF. PMH includes CAD, HTN, HLD, depression, anxiety.      PT Comments    Pt making steady progress today toward goals. Increased gait distance and no dizziness/lightheadedness reported.   Follow Up Recommendations  Home health PT;Supervision for mobility/OOB     Equipment Recommendations  Rolling walker with 5" wheels (family checking to see if they can borrow one)    Precautions / Restrictions Precautions Precautions: Back Precaution Comments: Reviewed back precautions after pt recalled 2/3 precautions Required Braces or Orthoses: Spinal Brace Spinal Brace: Lumbar corset;Applied in sitting position Restrictions Weight Bearing Restrictions: No    Mobility  Bed Mobility     Rolling: Min assist Sidelying to sit: Min assist       General bed mobility comments: with bed flat and no rails: multimodal cues on sequencing and technique.  Transfers Overall transfer level: Needs assistance Equipment used: Rolling walker (2 wheeled)   Sit to Stand: Supervision         General transfer comment: cues for hand placement and anterior weight shifting to stand up.  Ambulation/Gait Ambulation/Gait assistance: Min guard;Supervision Ambulation Distance (Feet): 110 Feet   Gait Pattern/deviations: Step-through pattern;Decreased stride length;Narrow base of support Gait velocity: decreased Gait velocity interpretation: Below normal speed for age/gender General Gait Details: guarded and stiff gait pattern intiially, improved with continued gait       Cognition Arousal/Alertness: Awake/alert Behavior During Therapy: WFL for tasks assessed/performed Overall Cognitive Status: Within Functional Limits for tasks assessed           Pertinent Vitals/Pain Pain Assessment: 0-10 Pain Score: 5   Pain Location: back Pain Descriptors / Indicators: Aching;Sore;Discomfort Pain Intervention(s): Limited activity within patient's tolerance;Monitored during session;Premedicated before session;Repositioned     PT Goals (current goals can now be found in the care plan section) Acute Rehab PT Goals Patient Stated Goal: to get back to life PT Goal Formulation: With patient Time For Goal Achievement: 03/11/15 Potential to Achieve Goals: Fair Progress towards PT goals: Progressing toward goals    Frequency  Min 5X/week    PT Plan Current plan remains appropriate    End of Session Equipment Utilized During Treatment: Gait belt;Back brace Activity Tolerance: Patient tolerated treatment well Patient left: in chair;with call bell/phone within reach     Time: 1354-1418 PT Time Calculation (min) (ACUTE ONLY): 24 min  Charges:  $Gait Training: 8-22 mins $Therapeutic Activity: 8-22 mins           Willow Ora 02/26/2015, 10:09 PM   Willow Ora, PTA, CLT Acute Rehab Services [phone] 02/26/15, 10:11 PM

## 2015-02-27 NOTE — Progress Notes (Signed)
No acute events AVSS Full strength bilateral lower extremities Incision clean dry and intact Neurologically stable I encouraged discharged today but she doesn't feel capable of that, hopefully she will go home tomorrow

## 2015-02-27 NOTE — Progress Notes (Signed)
Physical Therapy Treatment Patient Details Name: Natalya Domzalski MRN: [id number] DOB: Jan 15, 1956 Today's Date: 02/27/2015    History of Present Illness Patient is a 59 y/o female s/p L4-5 PLIF. PMH includes CAD, HTN, HLD, depression, anxiety.      PT Comments    Pt moving fairly well- slow & guarded but progressing.  Possible d/c home tomorrow- needs to practice steps before d/c.   Follow Up Recommendations  Home health PT;Supervision for mobility/OOB     Equipment Recommendations  Rolling walker with 5" wheels    Recommendations for Other Services       Precautions / Restrictions Precautions Precautions: Back Precaution Comments: Reviewed back precautions after pt recalled 2/3 precautions Required Braces or Orthoses: Spinal Brace Spinal Brace: Lumbar corset;Applied in sitting position Restrictions Weight Bearing Restrictions: No    Mobility  Bed Mobility Overal bed mobility: Needs Assistance Bed Mobility: Rolling;Sidelying to Sit Rolling: Min guard Sidelying to sit: Min guard       General bed mobility comments: bed flat with no rails; cues for sequencing & technique  Transfers Overall transfer level: Needs assistance Equipment used: Rolling walker (2 wheeled) Transfers: Sit to/from Stand Sit to Stand: Supervision         General transfer comment: cues for hand placement  Ambulation/Gait Ambulation/Gait assistance: Min guard Ambulation Distance (Feet): 150 Feet Assistive device: Rolling walker (2 wheeled) Gait Pattern/deviations: Step-through pattern;Decreased stride length     General Gait Details: slow & guarded gait but steady; cues to relax UE's/shoulders   Stairs (pt deferred stair training this session)            Wheelchair Mobility    Modified Rankin (Stroke Patients Only)       Balance                                    Cognition Arousal/Alertness: Awake/alert Behavior During Therapy: WFL for tasks  assessed/performed Overall Cognitive Status: Within Functional Limits for tasks assessed                      Exercises      General Comments        Pertinent Vitals/Pain Pain Assessment: 0-10 Pain Score: 5  Pain Location: back Pain Descriptors / Indicators: Aching Pain Intervention(s): Monitored during session;Premedicated before session;Repositioned    Home Living                      Prior Function            PT Goals (current goals can now be found in the care plan section) Acute Rehab PT Goals Patient Stated Goal: to get back to life PT Goal Formulation: With patient Time For Goal Achievement: 03/11/15 Potential to Achieve Goals: Fair Progress towards PT goals: Progressing toward goals    Frequency  Min 5X/week    PT Plan Current plan remains appropriate    Co-evaluation             End of Session Equipment Utilized During Treatment: Gait belt;Back brace Activity Tolerance: Patient tolerated treatment well Patient left: in chair;with call bell/phone within reach     Time: 1118-1130 PT Time Calculation (min) (ACUTE ONLY): 12 min  Charges:  $Gait Training: 8-22 mins                    G Codes:  Sena Hitch 02/27/2015, 12:49 PM   Sarajane Marek, PTA [phone] 02/27/2015

## 2015-02-28 MED ORDER — OXYCODONE-ACETAMINOPHEN 5-325 MG PO TABS: 1.0000 | ORAL_TABLET | ORAL | Status: DC | PRN

## 2015-02-28 MED ORDER — CYCLOBENZAPRINE HCL 10 MG PO TABS: 10.0000 mg | ORAL_TABLET | Freq: Three times a day (TID) | ORAL | Status: DC | PRN

## 2015-02-28 NOTE — Progress Notes (Signed)
Patient with discharge order. Family will not be able to pick patient up until this afternoon.    Ave Filter, RN

## 2015-02-28 NOTE — Progress Notes (Signed)
Discharged to home via w/c. Pt's husband to driver her to her house. discharge teaching given on meds, IV d'cd, no redness edema drainage or tenderness at site. Pt verbalized understanding of all discharge instructions.

## 2015-02-28 NOTE — Discharge Instructions (Signed)
No lifting no bending no twisting no driving a riding a car unless she is coming back and forth to see me. Keep incision clean dry and intact.

## 2015-02-28 NOTE — Progress Notes (Signed)
Patient ID: Cindy [name], female   DOB: 1955-08-10, 59 y.o.   MRN: [id number] Doing well no leg pain neurologically intact discharge home

## 2015-02-28 NOTE — Care Management Note (Signed)
Case Management Note  Patient Details  Name: Cindy [name] MRN: [id number] Date of Birth: 01-07-1956  Subjective/Objective:                    Action/Plan: Received a telephone order from Dr Saintclair Halsted requesting that HHPT be arranged for patient at discharge. CM met with patient, who is agreeable to HHPT and has chosen [facility]. Miranda with [facility] was notified and has accepted the referral for discharge home today.  Bedside RN was updated. Expected Discharge Date:                  Expected Discharge Plan:  [place]  In-House Referral:     Discharge planning Services     Post Acute Care Choice:    Choice offered to:  Patient  DME Arranged:    DME Agency:     HH Arranged:  PT [place]:  [place]  Status of Service:  Completed, signed off  Medicare Important Message Given:    Date Medicare IM Given:    Medicare IM give by:    Date Additional Medicare IM Given:    Additional Medicare Important Message give by:     If discussed at [place] of Stay Meetings, dates discussed:    Additional Comments:  Rolm Baptise, RN 02/28/2015, 11:50 AM

## 2015-02-28 NOTE — Progress Notes (Signed)
Physical Therapy Treatment Patient Details Name: Cindy [name] MRN: [id number] DOB: February 09, 1956 Today's Date: 02/28/2015    History of Present Illness Patient is a 59 y/o female s/p L4-5 PLIF. PMH includes CAD, HTN, HLD, depression, anxiety.      PT Comments    Patient progressing with mobility, but still with pain and guarded.  Feel HHPT will assist with transition to home environment and ensure safety with walker.  Will follow up if not d/c.  Follow Up Recommendations  Home health PT;Supervision for mobility/OOB     Equipment Recommendations  Rolling walker with 5" wheels    Recommendations for Other Services       Precautions / Restrictions Precautions Precautions: Back Precaution Booklet Issued: Yes (comment) Precaution Comments: issued handout and reviewed for precautions Required Braces or Orthoses: Spinal Brace Spinal Brace: Lumbar corset;Applied in sitting position Restrictions Weight Bearing Restrictions: No    Mobility  Bed Mobility               General bed mobility comments: pt up in chair  Transfers   Equipment used: Rolling walker (2 wheeled) Transfers: Sit to/from Stand Sit to Stand: Modified independent (Device/Increase time)         General transfer comment: independently places hands on arms of chair  Ambulation/Gait   Ambulation Distance (Feet): 200 Feet Assistive device: Rolling walker (2 wheeled) Gait Pattern/deviations: Step-through pattern;Decreased stride length     General Gait Details: slow and guarded   Stairs Stairs: Yes Stairs assistance: Min assist Stair Management: Backwards;Step to pattern;With walker Number of Stairs: 4 General stair comments: cues for sequence and technique; handout given; also attempted with HHA, but reports too uncomfortable.  Wheelchair Mobility    Modified Rankin (Stroke Patients Only)       Balance Overall balance assessment: Needs assistance         Standing balance support:  Bilateral upper extremity supported Standing balance-Leahy Scale: Poor Standing balance comment: UE support for balance                    Cognition Arousal/Alertness: Awake/alert Behavior During Therapy: WFL for tasks assessed/performed Overall Cognitive Status: Within Functional Limits for tasks assessed                      Exercises      General Comments        Pertinent Vitals/Pain Pain Assessment: Faces Faces Pain Scale: Hurts even more Pain Location: back and hips at times Pain Descriptors / Indicators: Aching;Sore Pain Intervention(s): Monitored during session;Premedicated before session    Home Living                      Prior Function            PT Goals (current goals can now be found in the care plan section) Progress towards PT goals: Progressing toward goals    Frequency  Min 5X/week    PT Plan Current plan remains appropriate    Co-evaluation             End of Session Equipment Utilized During Treatment: Back brace Activity Tolerance: Patient tolerated treatment well Patient left: in chair;with call bell/phone within reach     Time: 0830-0848 PT Time Calculation (min) (ACUTE ONLY): 18 min  Charges:  $Gait Training: 8-22 mins                    G Codes:  Elissa Grieshop,CYNDI 02/28/2015, 10:43 AM  Magda Kiel, PT [phone] 02/28/2015

## 2015-02-28 NOTE — Discharge Summary (Signed)
  Physician Discharge Summary  Patient ID: Cindy [name] MRN: [id number] DOB/AGE: 1955/07/01 59 y.o.  Admit date: 02/23/2015 Discharge date: 02/28/2015  Admission Diagnoses:umbar spondylosis and grade 1 spondylolisthesis L4-5  Discharge Diagnoses: Same Active Problems:   Spinal stenosis of lumbar region   Discharged Condition: good  Hospital Course: Patient is [facility] underwent decompressive laminectomy and fusion postop patient did fairly well with recovered in the floor had to be kept flat bedrest for 2 days for interoperative CSF leak but mobilized well over the weekend pain became under better control and stable enough for discharge home on postop day 5. We scheduled follow-up in one week.  Consults: Significant Diagnostic Studies: Treatments: Decompressive laminectomy and fusion L4-5 Discharge Exam: Blood pressure 117/64, pulse 66, temperature 98.2 F (36.8 C), temperature source Oral, resp. rate 18, weight 79.561 kg (175 lb 6.4 oz), SpO2 96 %. Strength out of 5 wound clean dry and intact  Disposition: Home     Medication List    TAKE these medications        aspirin EC 325 MG tablet  Take 325 mg by mouth daily.     cyclobenzaprine 10 MG tablet  Commonly known as:  FLEXERIL  Take 1 tablet (10 mg total) by mouth 3 (three) times daily as needed for muscle spasms.     ezetimibe 10 MG tablet  Commonly known as:  ZETIA  Take 1 tablet (10 mg total) by mouth daily.     levothyroxine 100 MCG tablet  Commonly known as:  SYNTHROID, LEVOTHROID  Take 100 mcg by mouth daily.     lisinopril 20 MG tablet  Commonly known as:  PRINIVIL,ZESTRIL  Take 1 tablet (20 mg total) by mouth daily.     LIVALO 4 MG Tabs  Generic drug:  Pitavastatin Calcium  Take 4 mg by mouth daily.     metoprolol succinate 50 MG 24 hr tablet  Commonly known as:  TOPROL-XL  Take 1 tablet (50 mg total) by mouth daily.     nitroGLYCERIN 0.4 MG SL tablet  Commonly known as:  NITROSTAT  Place  1 tablet (0.4 mg total) under the tongue every 5 (five) minutes as needed for chest pain.     oxyCODONE-acetaminophen 5-325 MG tablet  Commonly known as:  PERCOCET/ROXICET  Take 1-2 tablets by mouth every 4 (four) hours as needed for moderate pain.     sertraline 50 MG tablet  Commonly known as:  ZOLOFT  Take 50 mg by mouth daily.           Follow-up Information    Follow up with [facility], [facility].   Specialty:  Neurosurgery   Contact information:   [address] [place] [address] [phone]       Signed: Elaina Hoops 02/28/2015, 10:08 AM

## 2015-03-14 ENCOUNTER — Emergency Department (HOSPITAL_COMMUNITY)
Admission: EM | Admit: 2015-03-14 | Discharge: 2015-03-14 | Disposition: A | Discharge status: Home / Self Care | Payer: 59 | Attending: Emergency Medicine | Admitting: Emergency Medicine

## 2015-03-14 ENCOUNTER — Encounter (HOSPITAL_COMMUNITY): Payer: Self-pay | Admitting: *Deleted

## 2015-03-14 DIAGNOSIS — M542 Cervicalgia: Secondary | ICD-10-CM | POA: Insufficient documentation

## 2015-03-14 DIAGNOSIS — Z7982 Long term (current) use of aspirin: Secondary | ICD-10-CM | POA: Insufficient documentation

## 2015-03-14 DIAGNOSIS — I10 Essential (primary) hypertension: Secondary | ICD-10-CM | POA: Insufficient documentation

## 2015-03-14 DIAGNOSIS — F419 Anxiety disorder, unspecified: Secondary | ICD-10-CM | POA: Insufficient documentation

## 2015-03-14 DIAGNOSIS — R5082 Postprocedural fever: Secondary | ICD-10-CM | POA: Insufficient documentation

## 2015-03-14 DIAGNOSIS — Z8701 Personal history of pneumonia (recurrent): Secondary | ICD-10-CM | POA: Insufficient documentation

## 2015-03-14 DIAGNOSIS — R51 Headache: Secondary | ICD-10-CM | POA: Insufficient documentation

## 2015-03-14 DIAGNOSIS — R519 Headache, unspecified: Secondary | ICD-10-CM

## 2015-03-14 DIAGNOSIS — E785 Hyperlipidemia, unspecified: Secondary | ICD-10-CM | POA: Insufficient documentation

## 2015-03-14 DIAGNOSIS — Z8719 Personal history of other diseases of the digestive system: Secondary | ICD-10-CM | POA: Insufficient documentation

## 2015-03-14 DIAGNOSIS — I25119 Atherosclerotic heart disease of native coronary artery with unspecified angina pectoris: Secondary | ICD-10-CM | POA: Insufficient documentation

## 2015-03-14 DIAGNOSIS — F329 Major depressive disorder, single episode, unspecified: Secondary | ICD-10-CM | POA: Insufficient documentation

## 2015-03-14 DIAGNOSIS — Z79899 Other long term (current) drug therapy: Secondary | ICD-10-CM | POA: Insufficient documentation

## 2015-03-14 DIAGNOSIS — E039 Hypothyroidism, unspecified: Secondary | ICD-10-CM | POA: Insufficient documentation

## 2015-03-14 DIAGNOSIS — M1990 Unspecified osteoarthritis, unspecified site: Secondary | ICD-10-CM | POA: Insufficient documentation

## 2015-03-14 NOTE — ED Notes (Addendum)
Pt arrives to ED c/o fever/chills/headahce. States that she had a spinal fusion 02/23/15 and was dc'd 02/28/15. States that last night she began experiencing headache and not feeling well. Went to bed and woke up 3Am feeling feverish and took tylenol and neurontin. Went back to bed and woke up feeling tired, HA, and feverish. Took another tylenol. Also c/o burning to incision site. No redness, swelling in drainage from site. States her temp at home was 99 degrees.

## 2015-03-14 NOTE — ED Provider Notes (Signed)
CSN: [id number]     Arrival date & time 03/14/15  1610 History   First MD Initiated Contact with Patient 03/14/15 1932     Chief Complaint  Patient presents with  . Post-op Problem     (Consider location/radiation/quality/duration/timing/severity/associated sxs/prior Treatment) The history is provided by the patient and medical records. No language interpreter was used.   Cindy [name] is a 59 y.o. female  with a PMH of CAD, HTN, HLD, who presents to the Emergency Department complaining of headache and fever x 1 day. Pt. States fever of 100, took tylenol which brought down temp. Headache is described as 3/10 ache running from bilateral posterior neck around head to eyes. No aggravating or alleviating factors; no radiation of pain  Of note, pt. Had a posterior lumbar fusion on 11/02 and concerned fever could be 2/2 complications. Pt. Denies loss of consciousness, saddle anesthesia, iv drug use, steroid use.   Past Medical History  Diagnosis Date  . Coronary artery disease     s/p PCI in 2006, treated with bare metal stent (3.0x45mm Driver placed in diagonal branch);  cath 8/12: mDx stent ok with 20% ISR, EF 65%;  ETT-Myoview 8/14:  Low risk, EF 80%, anterior defect likely represents diaphragmatic atten, normal wall motion  . Hypertension   . Dyslipidemia   . Osteoarthritis     Shoulder and hip  . Hypothyroidism   . Arthritis   . Uterine cancer ([place])   . Anemia   . Hyperlipidemia   . Anginal pain ([place])   . Pneumonia   . Depression   . Anxiety   . GERD (gastroesophageal reflux disease)   . Dysrhythmia     tachycardia if she misses Toprol   Past Surgical History  Procedure Laterality Date  . Total abdominal hysterectomy w/ bilateral salpingoophorectomy  2008  . Tonsillectomy and adenoidectomy    . Cardiac catheterization    . Colonoscopy    . Upper gastrointestinal endoscopy    . Abdominal hysterectomy    . Posterior lumbar fusion  02/23/2015    L4   L5   Family History   Problem Relation Age of Onset  . Heart attack Mother 66  . Diabetes Mother   . Heart disease Maternal Grandmother   . Colon cancer Neg Hx    Social History  Substance Use Topics  . Smoking status: Never Smoker   . Smokeless tobacco: Never Used     Comment: Tobacco use-no  . Alcohol Use: Yes     Comment: Social   OB History    No data available     Review of Systems  Constitutional: Positive for fever. Negative for chills, diaphoresis, activity change, appetite change and fatigue.  HENT: Negative for congestion, rhinorrhea and sore throat.   Eyes: Negative for visual disturbance.  Respiratory: Negative for cough, shortness of breath and wheezing.   Cardiovascular: Negative.   Gastrointestinal: Negative for nausea, vomiting, abdominal pain, diarrhea and constipation.  Endocrine: Negative for polydipsia and polyuria.  Musculoskeletal: Positive for neck pain. Negative for myalgias, back pain and arthralgias.  Skin: Negative for rash.  Neurological: Positive for headaches. Negative for dizziness and weakness.      Allergies  Morphine and related  Home Medications   Prior to Admission medications   Medication Sig Start Date End Date Taking? Authorizing Provider  acetaminophen (TYLENOL) 500 MG tablet Take 1,000 mg by mouth every 6 (six) hours as needed for fever.   Yes Historical Provider, MD  ALPRAZolam Duanne Moron)  0.5 MG tablet Take 0.5 tablets by mouth as needed. As needed for anxiety 01/28/15  Yes Historical Provider, MD  aspirin EC 325 MG tablet Take 325 mg by mouth daily.     Yes Historical Provider, MD  cyclobenzaprine (FLEXERIL) 10 MG tablet Take 1 tablet (10 mg total) by mouth 3 (three) times daily as needed for muscle spasms. 02/28/15  Yes Kary Kos, MD  ezetimibe (ZETIA) 10 MG tablet Take 1 tablet (10 mg total) by mouth daily. 01/30/11  Yes Sherren Mocha, MD  gabapentin (NEURONTIN) 300 MG capsule Take 1 capsule by mouth as needed. As needed for tingling 03/08/15  Yes  Historical Provider, MD  levothyroxine (SYNTHROID, LEVOTHROID) 100 MCG tablet Take 100 mcg by mouth daily.     Yes Historical Provider, MD  lisinopril (PRINIVIL,ZESTRIL) 20 MG tablet Take 1 tablet (20 mg total) by mouth daily. 01/30/11  Yes Sherren Mocha, MD  metoprolol (TOPROL-XL) 50 MG 24 hr tablet Take 1 tablet (50 mg total) by mouth daily. 01/30/11  Yes Sherren Mocha, MD  oxyCODONE-acetaminophen (PERCOCET/ROXICET) 5-325 MG tablet Take 1-2 tablets by mouth every 4 (four) hours as needed for moderate pain. 02/28/15  Yes Kary Kos, MD  Pitavastatin Calcium (LIVALO) 4 MG TABS Take 4 mg by mouth daily.   Yes Historical Provider, MD  sertraline (ZOLOFT) 50 MG tablet Take 50 mg by mouth daily. 01/20/15  Yes Historical Provider, MD  nitroGLYCERIN (NITROSTAT) 0.4 MG SL tablet Place 1 tablet (0.4 mg total) under the tongue every 5 (five) minutes as needed for chest pain. 11/24/10 02/10/15  Liliane Shi, PA-C   BP 119/74 mmHg  Pulse 68  Temp(Src) 98 F (36.7 C) (Oral)  Resp 16  Ht 5\' 5"  (1.651 m)  Wt 77.111 kg  BMI 28.29 kg/m2  SpO2 99% Physical Exam  Constitutional: She is oriented to person, place, and time. She appears well-developed and well-nourished. No distress.  HENT:  Head: Normocephalic and atraumatic.  Mouth/Throat: Oropharynx is clear and moist.  No tenderness of the temporal artery   Eyes: Conjunctivae and EOM are normal. Pupils are equal, round, and reactive to light. No scleral icterus.  No nystagmus   Neck: Normal range of motion. Neck supple.  Full active and passive ROM without pain.  No midline tenderness. Mild paraspinal tenderness bilaterally  No nuchal rigidity or meningeal signs.  Cardiovascular: Normal rate, regular rhythm, normal heart sounds and intact distal pulses.  Exam reveals no gallop and no friction rub.   No murmur heard. Pulmonary/Chest: Effort normal and breath sounds normal. No respiratory distress. She has no wheezes. She has no rales.  Abdominal: Soft.  Bowel sounds are normal. She exhibits no distension. There is no tenderness. There is no rebound and no guarding.  Musculoskeletal: Normal range of motion.  Lymphadenopathy:    She has no cervical adenopathy.  Neurological: She is alert and oriented to person, place, and time. She has normal reflexes. Coordination normal.  Mental Status: Alert, oriented, and thought content is appropriate. Speech is fluent without evidence of aphasia. Able to follow two-step commands without difficulty.   Motor:  5/5 muscle strength of upper and lower extremities bilaterally including strong and equal grip strength and plantar/dorsiflexion.  Sensory: Diminished sensory of left LE   Skin: Skin is warm and dry. No rash noted. She is not diaphoretic.  Psychiatric: She has a normal mood and affect. Her behavior is normal. Judgment and thought content normal.  Nursing note and vitals reviewed.   ED Course  Procedures (including critical care time) Labs Review Labs Reviewed - No data to display  Imaging Review No results found. I have personally reviewed and evaluated these images and lab results as part of my medical decision-making.   EKG Interpretation None      MDM   Final diagnoses:  Headache, unspecified headache type   [facility] presents for fever and headache with concern this is related to her lumbar fusion on 11/02.  Neuro exam with decreased sensation to left LE - patient states that sensation has improved since surgery. No other focal neuro deficits.   I consulted neurosurgery and presented case to Dr. Cyndy Freeze who recommended strict return precautions and to follow up in office.   I explained the diagnosis and have given precautions as to when/if to return to the ER including for any other new or worsening symptoms. The patient understands and accepts the medical plan as it's been dictated and I have answered her questions. Discharge instructions concerning home care have been given.  The patient is stable and is discharged to home in good condition.   [facility], PA-C 03/14/15 2106  Merrily Pew, MD 03/15/15 [phone]

## 2015-03-14 NOTE — ED Notes (Signed)
PA at bedside.

## 2015-03-14 NOTE — Discharge Instructions (Signed)
Continue usual home medications, tylenol or motrin as needed for fever, rest, drink plenty of fluids.  Please follow up with the neurosurgeon listed above for discussion of your diagnoses and further evaluation after today's visit Please return to the ER for worsening pain, fever not controlled with tylenol/motrin, new numbness or tingling, any new or worsening symptoms, any additional concerns.

## 2015-04-14 ENCOUNTER — Encounter: Payer: Self-pay | Admitting: Gastroenterology

## 2016-03-22 ENCOUNTER — Ambulatory Visit (INDEPENDENT_AMBULATORY_CARE_PROVIDER_SITE_OTHER): Payer: Self-pay | Admitting: Orthopedic Surgery

## 2016-04-11 ENCOUNTER — Ambulatory Visit (INDEPENDENT_AMBULATORY_CARE_PROVIDER_SITE_OTHER): Payer: 59 | Admitting: Orthopedic Surgery

## 2016-04-11 ENCOUNTER — Ambulatory Visit (INDEPENDENT_AMBULATORY_CARE_PROVIDER_SITE_OTHER): Payer: 59

## 2016-04-11 ENCOUNTER — Encounter (INDEPENDENT_AMBULATORY_CARE_PROVIDER_SITE_OTHER): Payer: Self-pay | Admitting: Orthopedic Surgery

## 2016-04-11 DIAGNOSIS — M25552 Pain in left hip: Secondary | ICD-10-CM

## 2016-04-11 MED ORDER — DICLOFENAC SODIUM 2 % TD SOLN: 2.0000 | Freq: Two times a day (BID) | TRANSDERMAL | 1 refills | Status: DC

## 2016-04-11 NOTE — Progress Notes (Signed)
Office Visit Note   Patient: Cindy [name]           Date of Birth: Apr 15, 1956           MRN: [id number] Visit Date: 04/11/2016 Requested by: Cindy Gravel, MD Cindy [name], [place] [address] PCP: Cindy Gravel, MD  Subjective: Chief Complaint  Patient presents with  . Left Hip - Pain    HPI Cindy [name] is a 60 year old female with left hip groin and buttock pain.  It's been going on for about 3 months.  Takes Aleve and Advil.  She did have back fusion surgery 02/27/2015 which was slightly over a year ago.  That helped her right hip and back symptoms after to 3 months.  She now reports relatively atraumatic onset of left posterior hip symptoms with the addition of groin symptoms and lateral sided trochanteric symptoms.  She has had issues with left hip before.  This is required trochanteric injections in the past.  The pain does not radiate below the knee and there is no numbness and tingling.  The pain will wake her from sleep at night.  She also states that over the past 3 months she has not been able to lead with the left leg in terms of climbing stairs because of weakness.             Review of Systems All systems reviewed are negative as they relate to the chief complaint within the history of present illness.  Patient denies  fevers or chills.    Assessment & Plan: Visit Diagnoses:  1. Left hip pain     Plan: Impression is left hip pain which may be intrinsic to the hip although there is no real radiographic changes or he could be referred pain from adjacent segment disease above the level of the fusion.  We need an MRI scan of the left hip to evaluate for occult arthritis and/or bursitis.  I'll see her back after that study.  I'm going to give her some Pennsaid samples in order to try to alleviate some of the trochanteric bursa symptoms.  Follow-Up Instructions: Return for after MRI.   Orders:  Orders Placed This Encounter  Procedures  . XR HIP UNILAT W OR W/O PELVIS  1V LEFT   Meds ordered this encounter  Medications  . Diclofenac Sodium (PENNSAID) 2 % SOLN    Sig: Place 2 Squirts onto the skin 2 (two) times daily.    Dispense:  1 Bottle    Refill:  1      Procedures: No procedures performed   Clinical Data: No additional findings.  Objective: Vital Signs: There were no vitals taken for this visit.  Physical Exam   Constitutional: Patient appears well-developed HEENT:  Head: Normocephalic Eyes:EOM are normal Neck: Normal range of motion Cardiovascular: Normal rate Pulmonary/chest: Effort normal Neurologic: Patient is alert Skin: Skin is warm Psychiatric: Patient has normal mood and affect   Ortho Exam examination the left hip demonstrates normal gait.  No nerve root tension signs.  No real groin pain with internal/external rotation of either leg.  Has good hip flexion strength abduction and adduction strength.  Good ankle dorsi flexion plantar flexion strength.  She has a little bit of an area of paresthesias on the distal lateral left leg.  Pedal pulses palpable.  Negative clonus.  Specialty Comments:  No specialty comments available.  Imaging: No results found.   PMFS History: Patient Active Problem List   Diagnosis Date Noted  .  Spinal stenosis of lumbar region 02/23/2015  . Chest pain on exertion 12/19/2010  . Chest pain 11/24/2010  . HYPERLIPIDEMIA-MIXED 02/17/2010  . HYPERTENSION, BENIGN 02/17/2010  . CORONARY ATHEROSCLEROSIS NATIVE CORONARY ARTERY 02/17/2010   Past Medical History:  Diagnosis Date  . Anemia   . Anginal pain ([place])   . Anxiety   . Arthritis   . Coronary artery disease    s/p PCI in 2006, treated with bare metal stent (3.0x63mm Driver placed in diagonal branch);  cath 8/12: mDx stent ok with 20% ISR, EF 65%;  ETT-Myoview 8/14:  Low risk, EF 80%, anterior defect likely represents diaphragmatic atten, normal wall motion  . Depression   . Dyslipidemia   . Dysrhythmia    tachycardia if she misses  Toprol  . GERD (gastroesophageal reflux disease)   . Hyperlipidemia   . Hypertension   . Hypothyroidism   . Osteoarthritis    Shoulder and hip  . Pneumonia   . Uterine cancer ([place])     Family History  Problem Relation Age of Onset  . Heart attack Mother 46  . Diabetes Mother   . Heart disease Maternal Grandmother   . Colon cancer Neg Hx     Past Surgical History:  Procedure Laterality Date  . ABDOMINAL HYSTERECTOMY    . CARDIAC CATHETERIZATION    . COLONOSCOPY    . POSTERIOR LUMBAR FUSION  02/23/2015   L4   L5  . TONSILLECTOMY AND ADENOIDECTOMY    . TOTAL ABDOMINAL HYSTERECTOMY W/ BILATERAL SALPINGOOPHORECTOMY  2008  . UPPER GASTROINTESTINAL ENDOSCOPY     Social History   Occupational History  . [employer]   .  [employer]   Social History Main Topics  . Smoking status: Never Smoker  . Smokeless tobacco: Never Used     Comment: Tobacco use-no  . Alcohol use Yes     Comment: Social  . Drug use: No  . Sexual activity: Not on file

## 2016-04-28 ENCOUNTER — Ambulatory Visit
Admission: RE | Admit: 2016-04-28 | Discharge: 2016-04-28 | Disposition: A | Discharge status: Home / Self Care | Payer: 59 | Source: Ambulatory Visit | Attending: Orthopedic Surgery | Admitting: Orthopedic Surgery

## 2016-04-28 DIAGNOSIS — M25552 Pain in left hip: Secondary | ICD-10-CM

## 2016-05-04 ENCOUNTER — Encounter (INDEPENDENT_AMBULATORY_CARE_PROVIDER_SITE_OTHER): Payer: Self-pay | Admitting: Orthopedic Surgery

## 2016-05-04 ENCOUNTER — Ambulatory Visit (INDEPENDENT_AMBULATORY_CARE_PROVIDER_SITE_OTHER): Payer: 59 | Admitting: Orthopedic Surgery

## 2016-05-04 DIAGNOSIS — M25552 Pain in left hip: Secondary | ICD-10-CM | POA: Diagnosis not present

## 2016-05-04 MED ORDER — BUPIVACAINE HCL 0.25 % IJ SOLN
4.0000 mL | INTRAMUSCULAR | Status: AC | PRN
  Administered 2016-05-04: 4 mL via INTRA_ARTICULAR

## 2016-05-04 MED ORDER — TRIAMCINOLONE ACETONIDE 40 MG/ML IJ SUSP
40.0000 mg | INTRAMUSCULAR | Status: AC | PRN
  Administered 2016-05-04: 40 mg via INTRA_ARTICULAR

## 2016-05-04 MED ORDER — LIDOCAINE HCL 1 % IJ SOLN
5.0000 mL | INTRAMUSCULAR | Status: AC | PRN
  Administered 2016-05-04: 5 mL

## 2016-05-04 NOTE — Progress Notes (Signed)
Office Visit Note   Patient: Cindy [name]           Date of Birth: 1956-02-01           MRN: [id number] Visit Date: 05/04/2016 Requested by: Cindy Gravel, MD [place], [place] [address] PCP: Cindy Gravel, MD  Subjective: Chief Complaint  Patient presents with  . Left Hip - Pain    HPI Cindy [name] is a 61 year old patient with left hip pain.  She still has deep buttock and trochanteric pain.  Since I've seen her she has had an MRI scan.  This was done of the left hip..  We reviewed the scans ourselves that she does have is a very small fluid collection just on the lateral aspect of the trochanter but it's not really hugely significant.  Not much in the way of arthritis muscle tearing or other issues present.              Review of Systems All systems reviewed are negative as they relate to the chief complaint within the history of present illness.  Patient denies  fevers or chills.    Assessment & Plan: Visit Diagnoses:  1. Pain of left hip joint     Plan: Impression is left hip pain.  She's had a trochanteric injection in the past which is helped.  Another one was done today under ultrasound guidance.  No hip arthritis is present.  I'll see how she does with this intervention and we will follow-up with her as needed.  Follow-Up Instructions: Return if symptoms worsen or fail to improve.   Orders:  No orders of the defined types were placed in this encounter.  No orders of the defined types were placed in this encounter.     Procedures: Large Joint Inj Date/Time: 05/04/2016 6:06 PM Performed by: Cindy [name] Authorized by: Cindy [name]   Consent Given by:  Patient Site marked: the procedure site was marked   Timeout: prior to procedure the correct patient, procedure, and site was verified   Indications:  Pain and diagnostic evaluation Location:  Hip Site:  L greater trochanter Prep: patient was prepped and draped in usual sterile fashion     Needle Size:  18 G Needle Length:  3.5 inches Approach:  Lateral Ultrasound Guidance: Yes   Fluoroscopic Guidance: No   Arthrogram: No   Medications:  5 mL lidocaine 1 %; 40 mg triamcinolone acetonide 40 MG/ML; 4 mL bupivacaine 0.25 % Aspiration Attempted: No   Patient tolerance:  Patient tolerated the procedure well with no immediate complications     Clinical Data: No additional findings.  Objective: Vital Signs: There were no vitals taken for this visit.  Physical Exam   Constitutional: Patient appears well-developed HEENT:  Head: Normocephalic Eyes:EOM are normal Neck: Normal range of motion Cardiovascular: Normal rate Pulmonary/chest: Effort normal Neurologic: Patient is alert Skin: Skin is warm Psychiatric: Patient has normal mood and affect    Ortho Exam examination of the left hip demonstrates full active and passive range of motion tenderness over the trochanteric region is present.  Motor sensory function to the foot is intact pedal pulses palpable no other masses lymph adenopathy or skin changes noted in the left hip region.  Specialty Comments:  No specialty comments available.  Imaging: No results found.   PMFS History: Patient Active Problem List   Diagnosis Date Noted  . Pain of left hip joint 05/04/2016  . Spinal stenosis of lumbar region 02/23/2015  .  Chest pain on exertion 12/19/2010  . Chest pain 11/24/2010  . HYPERLIPIDEMIA-MIXED 02/17/2010  . HYPERTENSION, BENIGN 02/17/2010  . CORONARY ATHEROSCLEROSIS NATIVE CORONARY ARTERY 02/17/2010   Past Medical History:  Diagnosis Date  . Anemia   . Anginal pain ([place])   . Anxiety   . Arthritis   . Coronary artery disease    s/p PCI in 2006, treated with bare metal stent (3.0x43mm Driver placed in diagonal branch);  cath 8/12: mDx stent ok with 20% ISR, EF 65%;  ETT-Myoview 8/14:  Low risk, EF 80%, anterior defect likely represents diaphragmatic atten, normal wall motion  . Depression   .  Dyslipidemia   . Dysrhythmia    tachycardia if she misses Toprol  . GERD (gastroesophageal reflux disease)   . Hyperlipidemia   . Hypertension   . Hypothyroidism   . Osteoarthritis    Shoulder and hip  . Pneumonia   . Uterine cancer ([place])     Family History  Problem Relation Age of Onset  . Heart attack Mother 25  . Diabetes Mother   . Heart disease Maternal Grandmother   . Colon cancer Neg Hx     Past Surgical History:  Procedure Laterality Date  . ABDOMINAL HYSTERECTOMY    . CARDIAC CATHETERIZATION    . COLONOSCOPY    . POSTERIOR LUMBAR FUSION  02/23/2015   L4   L5  . TONSILLECTOMY AND ADENOIDECTOMY    . TOTAL ABDOMINAL HYSTERECTOMY W/ BILATERAL SALPINGOOPHORECTOMY  2008  . UPPER GASTROINTESTINAL ENDOSCOPY     Social History   Occupational History  . [employer]   .  [employer]   Social History Main Topics  . Smoking status: Never Smoker  . Smokeless tobacco: Never Used     Comment: Tobacco use-no  . Alcohol use Yes     Comment: Social  . Drug use: No  . Sexual activity: Not on file

## 2016-08-08 ENCOUNTER — Other Ambulatory Visit: Payer: Self-pay | Admitting: Orthopedic Surgery

## 2016-08-08 DIAGNOSIS — M7531 Calcific tendinitis of right shoulder: Secondary | ICD-10-CM

## 2016-08-28 ENCOUNTER — Ambulatory Visit
Admission: RE | Admit: 2016-08-28 | Discharge: 2016-08-28 | Disposition: A | Discharge status: Home / Self Care | Payer: 59 | Source: Ambulatory Visit | Attending: Orthopedic Surgery | Admitting: Orthopedic Surgery

## 2016-08-28 DIAGNOSIS — M7531 Calcific tendinitis of right shoulder: Secondary | ICD-10-CM

## 2016-12-05 DIAGNOSIS — H40003 Preglaucoma, unspecified, bilateral: Secondary | ICD-10-CM | POA: Insufficient documentation

## 2016-12-05 DIAGNOSIS — H25813 Combined forms of age-related cataract, bilateral: Secondary | ICD-10-CM | POA: Insufficient documentation

## 2016-12-05 DIAGNOSIS — H353132 Nonexudative age-related macular degeneration, bilateral, intermediate dry stage: Secondary | ICD-10-CM | POA: Insufficient documentation

## 2016-12-05 DIAGNOSIS — D3131 Benign neoplasm of right choroid: Secondary | ICD-10-CM | POA: Insufficient documentation

## 2017-07-01 ENCOUNTER — Ambulatory Visit: Payer: Managed Care, Other (non HMO) | Admitting: Cardiovascular Disease

## 2017-07-01 ENCOUNTER — Encounter: Payer: Self-pay | Admitting: Cardiovascular Disease

## 2017-07-01 VITALS — BP 124/86 | HR 52 | Ht 65.0 in | Wt 175.1 lb

## 2017-07-01 DIAGNOSIS — R0602 Shortness of breath: Secondary | ICD-10-CM | POA: Diagnosis not present

## 2017-07-01 DIAGNOSIS — I25119 Atherosclerotic heart disease of native coronary artery with unspecified angina pectoris: Secondary | ICD-10-CM | POA: Diagnosis not present

## 2017-07-01 NOTE — Patient Instructions (Signed)
Medication Instructions:  Your provider recommends that you continue on your current medications as directed. Please refer to the Current Medication list given to you today.    Labwork: None  Testing/Procedures: Your physician has requested that you have a stress echocardiogram. For further information please visit [email or web address]. Please follow instruction sheet as given.  Follow-Up: Your provider wants you to follow-up in: 1 year with Dr. Burt Knack. You will receive a reminder letter in the mail two months in advance. If you don't receive a letter, please call our office to schedule the follow-up appointment.    Any Other Special Instructions Will Be Listed Below (If Applicable).     If you need a refill on your cardiac medications before your next appointment, please call your pharmacy.

## 2017-07-01 NOTE — Progress Notes (Signed)
Cardiology Office Note Date:  07/01/2017   ID:  [name], DOB 1955/12/18, MRN [id number]  PCP:  Jani Gravel, MD  Cardiologist:  Sherren Mocha, MD    Chief Complaint  Patient presents with  . Shortness of Breath   History of Present Illness: Cindy [name] is a 62 y.o. female who presents for follow-up evaluation.  The patient has been followed for coronary artery disease, hypertension, and hyperlipidemia.  She underwent stenting of the diagonal branch of the LAD in 2006.  Her most recent heart catheterization demonstrated continued patency of the diagonal stent with 20% in-stent restenosis and otherwise patent coronary arteries with an ejection fraction of 65%.  She is been felt to have microvascular angina.  She was last seen in our office in November 2016 for preoperative cardiac clearance by Richardson Dopp.  But the patient is here alone today.  She has noticed increasing shortness of breath with activity.  She took a recent trip to [place] and was short of breath when walking up an incline.  She feels like this as initiated a degree of anxiety.  Some of the symptoms she is having are like those she had prior to undergoing coronary stenting.  She has not had chest pain.  She has had some cough which was present prior to her stent as well.  No orthopnea, PND, or leg swelling.  Occasional heart palpitations are noted.   Past Medical History:  Diagnosis Date  . Anemia   . Anginal pain ([place])   . Anxiety   . Arthritis   . Coronary artery disease    s/p PCI in 2006, treated with bare metal stent (3.0x49mm Driver placed in diagonal branch);  cath 8/12: mDx stent ok with 20% ISR, EF 65%;  ETT-Myoview 8/14:  Low risk, EF 80%, anterior defect likely represents diaphragmatic atten, normal wall motion  . Depression   . Dyslipidemia   . Dysrhythmia    tachycardia if she misses Toprol  . GERD (gastroesophageal reflux disease)   . Hyperlipidemia   . Hypertension   . Hypothyroidism   .  Osteoarthritis    Shoulder and hip  . Pneumonia   . Uterine cancer [facility])     Past Surgical History:  Procedure Laterality Date  . ABDOMINAL HYSTERECTOMY    . CARDIAC CATHETERIZATION    . COLONOSCOPY    . POSTERIOR LUMBAR FUSION  02/23/2015   L4   L5  . TONSILLECTOMY AND ADENOIDECTOMY    . TOTAL ABDOMINAL HYSTERECTOMY W/ BILATERAL SALPINGOOPHORECTOMY  2008  . UPPER GASTROINTESTINAL ENDOSCOPY      Current Outpatient Medications  Medication Sig Dispense Refill  . acetaminophen (TYLENOL) 500 MG tablet Take 1,000 mg by mouth every 6 (six) hours as needed for fever.    . ALPRAZolam (XANAX) 0.5 MG tablet Take 0.5 tablets by mouth as needed. As needed for anxiety    . aspirin EC 325 MG tablet Take 325 mg by mouth daily.      Marland Kitchen ezetimibe (ZETIA) 10 MG tablet Take 1 tablet (10 mg total) by mouth daily. 90 tablet 3  . gabapentin (NEURONTIN) 300 MG capsule Take 1 capsule by mouth daily as needed (tingling). As needed for tingling  3  . lisinopril (PRINIVIL,ZESTRIL) 20 MG tablet Take 1 tablet (20 mg total) by mouth daily. 90 tablet 3  . metoprolol (TOPROL-XL) 50 MG 24 hr tablet Take 1 tablet (50 mg total) by mouth daily. 90 tablet 3  . nitroGLYCERIN (NITROSTAT) 0.4 MG SL tablet  Place 0.4 mg under the tongue every 5 (five) minutes as needed for chest pain.    . pregabalin (LYRICA) 50 MG capsule Take 50 mg by mouth 2 (two) times daily.    . rosuvastatin (CRESTOR) 20 MG tablet Take 20 mg by mouth daily.    . sertraline (ZOLOFT) 50 MG tablet Take 50 mg by mouth daily.  6  . SYNTHROID 112 MCG tablet Take 112 mcg by mouth daily.     No current facility-administered medications for this visit.     Allergies:   Morphine and related   Social History:  The patient  reports that  has never smoked. she has never used smokeless tobacco. She reports that she drinks alcohol. She reports that she does not use drugs.   Family History:  The patient's family history includes Diabetes in her mother; Heart  attack (age of onset: 31) in her mother; Heart disease in her maternal grandmother.    ROS:  Please see the history of present illness.  Otherwise, review of systems is positive for muscle pain, leg pain, anxiety.  All other systems are reviewed and negative.    PHYSICAL EXAM: VS:  BP 124/86   Pulse (!) 52   Ht 5\' 5"  (1.651 m)   Wt 175 lb 1.9 oz (79.4 kg)   BMI 29.14 kg/m  , BMI Body mass index is 29.14 kg/m. GEN: Well nourished, well developed, in no acute distress  HEENT: normal  Neck: no JVD, no masses. No carotid bruits Cardiac: RRR without murmur or gallop                Respiratory:  clear to auscultation bilaterally, normal work of breathing GI: soft, nontender, nondistended, + BS MS: no deformity or atrophy  Ext: no pretibial edema, pedal pulses 2+= bilaterally Skin: warm and dry, no rash Neuro:  Strength and sensation are intact Psych: euthymic mood, full affect  EKG:  EKG is ordered today. The ekg ordered today shows sinus bradycardia 52 bpm, otherwise within normal limits.  Recent Labs: No results found for requested labs within last 8760 hours.   Lipid Panel  No results found for: CHOL, TRIG, HDL, CHOLHDL, VLDL, LDLCALC, LDLDIRECT    Wt Readings from Last 3 Encounters:  07/01/17 175 lb 1.9 oz (79.4 kg)  03/14/15 170 lb (77.1 kg)  02/23/15 175 lb 6.4 oz (79.6 kg)     ASSESSMENT AND PLAN: 1.  CAD, native vessel, with angina: The patient is experiencing some symptoms that she had back prior to her remote PCI procedure.  Her last heart catheterization from 2012 is reviewed which demonstrated patency of her stent site and mild nonobstructive disease elsewhere.  I have recommended a stress echocardiogram for further evaluation.  If this study is negative, I would recommend restarting Ranexa at the previous dose which she tolerated well.  Blood pressure is controlled on current therapy.  2.  Hypertension: She will continue lisinopril, metoprolol succinate.  3.   Hyperlipidemia: Lipids were elevated.  She has been now been started on Crestor 20 mg daily to go along with Zetia.  Follow-up labs are planned by her primary physician.  Current medicines are reviewed with the patient today.  The patient does not have concerns regarding medicines.  Labs/ tests ordered today include:   Orders Placed This Encounter  Procedures  . EKG 12-Lead  . ECHOCARDIOGRAM STRESS TEST    Disposition:   FU one year  Signed, Sherren Mocha, MD  07/01/2017 1:37 PM  [facility] [place], [place], [place]  [address] Phone: [phone]; Fax: [phone]

## 2017-07-18 ENCOUNTER — Telehealth (HOSPITAL_COMMUNITY): Payer: Self-pay | Admitting: *Deleted

## 2017-07-18 NOTE — Telephone Encounter (Signed)
Patient given instructions for upcoming stress echo.  Cindy [name]  

## 2017-07-22 HISTORY — PX: OTHER SURGICAL HISTORY: SHX169

## 2017-07-23 ENCOUNTER — Ambulatory Visit (HOSPITAL_COMMUNITY): Payer: Managed Care, Other (non HMO) | Attending: Cardiovascular Disease

## 2017-07-23 ENCOUNTER — Other Ambulatory Visit: Payer: Self-pay

## 2017-07-23 ENCOUNTER — Ambulatory Visit (HOSPITAL_COMMUNITY): Payer: Managed Care, Other (non HMO)

## 2017-07-23 DIAGNOSIS — I25119 Atherosclerotic heart disease of native coronary artery with unspecified angina pectoris: Secondary | ICD-10-CM | POA: Insufficient documentation

## 2017-07-23 DIAGNOSIS — I77810 Thoracic aortic ectasia: Secondary | ICD-10-CM | POA: Insufficient documentation

## 2017-07-23 DIAGNOSIS — I10 Essential (primary) hypertension: Secondary | ICD-10-CM | POA: Diagnosis not present

## 2017-07-23 DIAGNOSIS — E785 Hyperlipidemia, unspecified: Secondary | ICD-10-CM | POA: Diagnosis not present

## 2017-07-23 DIAGNOSIS — I071 Rheumatic tricuspid insufficiency: Secondary | ICD-10-CM | POA: Insufficient documentation

## 2017-07-23 DIAGNOSIS — R0602 Shortness of breath: Secondary | ICD-10-CM | POA: Insufficient documentation

## 2017-07-23 DIAGNOSIS — Z8249 Family history of ischemic heart disease and other diseases of the circulatory system: Secondary | ICD-10-CM | POA: Insufficient documentation

## 2018-08-25 ENCOUNTER — Ambulatory Visit: Payer: Managed Care, Other (non HMO) | Admitting: Cardiovascular Disease

## 2018-09-03 ENCOUNTER — Telehealth: Payer: Self-pay | Admitting: Cardiovascular Disease

## 2018-09-03 NOTE — Telephone Encounter (Signed)
Informed patient there are no in-office visits at this time.  She is also having an RA flare up. She understands she will be called to make an appointment for June or July when the scheduled are complete.

## 2018-09-03 NOTE — Telephone Encounter (Signed)
New Message    Pt is calling and is wanting an in office visit with Dr Burt Knack     Please call

## 2018-09-19 ENCOUNTER — Other Ambulatory Visit: Payer: Self-pay

## 2018-09-19 ENCOUNTER — Ambulatory Visit: Payer: 59 | Attending: Rheumatology

## 2018-09-19 DIAGNOSIS — M069 Rheumatoid arthritis, unspecified: Secondary | ICD-10-CM | POA: Insufficient documentation

## 2018-09-19 NOTE — Therapy (Addendum)
[place], [place], [address] Phone: [phone]   Fax:  [phone]  Physical Therapy Evaluation/FCE  Patient Details  Name: Cindy [name] MRN: [id number] Date of Birth: 1955/12/14 Referring Provider (PT): Sabino Niemann, MD   Encounter Date: 09/19/2018  PT End of Session - 09/19/18 0801    Visit Number  1    Number of Visits  1    PT Start Time  0800    PT Stop Time  1158    PT Time Calculation (min)  238 min    Activity Tolerance  Patient limited by pain    Behavior During Therapy  [facility] for tasks assessed/performed       Past Medical History:  Diagnosis Date  . Anemia   . Anginal pain ([place])   . Anxiety   . Arthritis   . Coronary artery disease    s/p PCI in 2006, treated with bare metal stent (3.0x29m Driver placed in diagonal branch);  cath 8/12: mDx stent ok with 20% ISR, EF 65%;  ETT-Myoview 8/14:  Low risk, EF 80%, anterior defect likely represents diaphragmatic atten, normal wall motion  . Depression   . Dyslipidemia   . Dysrhythmia    tachycardia if she misses Toprol  . GERD (gastroesophageal reflux disease)   . Hyperlipidemia   . Hypertension   . Hypothyroidism   . Osteoarthritis    Shoulder and hip  . Pneumonia   . Uterine cancer ([facility]     Past Surgical History:  Procedure Laterality Date  . ABDOMINAL HYSTERECTOMY    . CARDIAC CATHETERIZATION    . COLONOSCOPY    . POSTERIOR LUMBAR FUSION  02/23/2015   L4   L5  . TONSILLECTOMY AND ADENOIDECTOMY    . TOTAL ABDOMINAL HYSTERECTOMY W/ BILATERAL SALPINGOOPHORECTOMY  2008  . UPPER GASTROINTESTINAL ENDOSCOPY      There were no vitals filed for this visit.   Subjective Assessment - 09/19/18 0809    Subjective  She reports RA and is looking for possile Social Security disability.         [facility] - 09/19/18 0001      Assessment   Medical Diagnosis  Rheumatoid Athritis    Referring Provider (PT)  TSabino Niemann MD     Onset Date/Surgical Date  --   over a year   Next MD Visit  3 weeks    Prior Therapy  None      Precautions   Precautions  None      Restrictions   Weight Bearing Restrictions  No      Balance Screen   Has the patient fallen in the past 6 months  No      Prior Function   Level of Independence  Needs assistance with ADLs;Needs assistance with homemaking    Vocation  Full time employment      Cognition   Overall Cognitive Status  Within Functional Limits for tasks assessed                Objective measurements completed on examination: See above findings.              PT Education - 09/19/18 1233    Education Details  Suggested she stay active but do not do anything strenuous for 2-3  dasy if she experiences  post FCE soreness    Person(s) Educated  Patient    Methods  Explanation    Comprehension  Verbalized understanding  FCE report will be faxed to Dr Dossie Der next week  Plan - 09/19/18 1239    Clinical Impression Statement  Cindy [name] completed the FCE testing with rating of medium level work for an 8 hour day. She reported this was a moderately good day with a bad day she would stay in bed. She also reports she was started on prednisone today and this may have impacted the results.    PT Next Visit Plan  FCE will be faxed to Dr Vickey Huger and Agree with Plan of Care  Patient       Patient will benefit from skilled therapeutic intervention in order to improve the following deficits and impairments:     Visit Diagnosis: Rheumatoid arthritis involving multiple sites, unspecified rheumatoid factor presence [facility])     Problem List Patient Active Problem List   Diagnosis Date Noted  . Pain of left hip joint 05/04/2016  . Spinal stenosis of lumbar region 02/23/2015  . Chest pain on exertion 12/19/2010  . Chest pain 11/24/2010  . HYPERLIPIDEMIA-MIXED 02/17/2010  . HYPERTENSION, BENIGN 02/17/2010  . CORONARY  ATHEROSCLEROSIS NATIVE CORONARY ARTERY 02/17/2010    Darrel Hoover  PT 09/19/2018, 12:41 PM  [facility] [address] [place], [place], [address] Phone: [phone]   Fax:  [phone]  Name: Cindy [name] MRN: [id number] Date of Birth: 09-28-1955 PHYSICAL THERAPY DISCHARGE SUMMARY  Visits from Start of Care: FCE only  Current functional level related to goals / functional outcomes: NA   Remaining deficits: NA   Education / Equipment: NA  Plan: Patient agrees to discharge.  Patient goals were not met. Patient is being discharged due to                                                     ?????   Completing the FCE

## 2018-10-12 ENCOUNTER — Telehealth: Payer: 59 | Admitting: Physician Assistant

## 2018-10-12 DIAGNOSIS — T63331A Toxic effect of venom of brown recluse spider, accidental (unintentional), initial encounter: Secondary | ICD-10-CM

## 2018-10-12 NOTE — Progress Notes (Signed)
Based on what you shared with me, I feel your condition warrants further evaluation and I recommend that you be seen for a face to face office visit. I have reviewed your questionnaire and the picture attached. My concern giving the defined area of necrosis in the center of the area with the crusted purulent drainage, is for a brown recluse spider bite or something with similar toxicity. Giving the skin breakdown, I recommend that you be seen in person so the area can be appropriately debrided and the proper antibiotic therapy can be initiated. I do not want this area to get larger.   NOTE: If you entered your credit card information for this eVisit, you will not be charged. You may see a "hold" on your card for the $35 but that hold will drop off and you will not have a charge processed.  If you are having a true medical emergency please call 911.     For an urgent face to face visit, [place] has five urgent care centers for your convenience:    [email or web address] to reserve your spot online an avoid wait times  [facility] [address], [address] [place], [place] [address] Modified hours of operation: Monday-Friday, 12 PM to 6 PM  Closed Saturday & Sunday  *Across the street from [place] (New Address!) [address] [place], [place] [address] *Just off [employer], across the road from [place] hours of operation: Monday-Friday, 12 PM to 6 PM  Closed Saturday & Sunday   The following sites will take your insurance:  . [facility]    [phone]                  Get Driving Directions  [address], [place] [address] . 10 am to 8 pm Monday-Friday . 12 pm to 8 pm Saturday-Sunday   . [facility] at [place]                  Get Driving Directions  [address], [place], [place] [address] . 8 am to 8 pm  Monday-Friday . 9 am to 6 pm Saturday . 11 am to 6 pm Sunday   . [facility] at [place]                  Get Driving Directions   [address] [place], [place] [address] . 8 am to 8 pm Monday-Friday . 8 am to 4 pm Saturday-Sunday    . [facility] at [place]                    Get Driving Directions  [phone]  [address] [place], [place] [address]  . Monday-Friday, 12 PM to 6 PM    Your e-visit answers were reviewed by a board certified advanced clinical practitioner to complete your personal care plan.  Thank you for using e-Visits.

## 2018-10-14 ENCOUNTER — Other Ambulatory Visit: Payer: Self-pay | Admitting: Neurosurgery

## 2018-10-14 DIAGNOSIS — M542 Cervicalgia: Secondary | ICD-10-CM

## 2018-11-07 ENCOUNTER — Other Ambulatory Visit: Payer: 59

## 2018-11-08 ENCOUNTER — Ambulatory Visit
Admission: RE | Admit: 2018-11-08 | Discharge: 2018-11-08 | Disposition: A | Discharge status: Home / Self Care | Payer: 59 | Source: Ambulatory Visit | Attending: Neurosurgery | Admitting: Neurosurgery

## 2018-11-08 ENCOUNTER — Other Ambulatory Visit: Payer: Self-pay

## 2018-11-08 DIAGNOSIS — M542 Cervicalgia: Secondary | ICD-10-CM

## 2018-11-21 ENCOUNTER — Encounter

## 2018-12-12 NOTE — Telephone Encounter (Signed)
Offered patient several appointments and she declined. She states she will call when she would like to arrange.

## 2018-12-24 ENCOUNTER — Other Ambulatory Visit: Payer: Self-pay

## 2018-12-24 ENCOUNTER — Ambulatory Visit: Payer: 59 | Admitting: Cardiology

## 2018-12-24 ENCOUNTER — Encounter: Payer: Self-pay | Admitting: Cardiology

## 2018-12-24 VITALS — BP 154/89 | HR 62 | Ht 65.0 in | Wt 170.6 lb

## 2018-12-24 DIAGNOSIS — I10 Essential (primary) hypertension: Secondary | ICD-10-CM

## 2018-12-24 DIAGNOSIS — E785 Hyperlipidemia, unspecified: Secondary | ICD-10-CM

## 2018-12-24 DIAGNOSIS — I2510 Atherosclerotic heart disease of native coronary artery without angina pectoris: Secondary | ICD-10-CM | POA: Diagnosis not present

## 2018-12-24 NOTE — Progress Notes (Signed)
PCP: Jani Gravel, MD  Clinic Note: Chief Complaint  Patient presents with   Follow-up    Changing cardiologist   Coronary Artery Disease    Distant history of PCI to diagonal branch    HPI: Cindy [name] is a 63 y.o. female with a PMH notable for coronary artery disease with PCI of a Diagonal back in 2006 (noted to be patent with 20% ISR in follow-up cath in 2012-seen in [facility]) who presents for delayed annual follow-up.  She had previously been seen by Dr.Cooper and Richardson Dopp, PA (but is switched cardiologist for access)  [facility] was last seen on in March 2019 by Dr. Burt Knack.  She noticed some increasing exertional dyspnea, most noted on the recent trip to [place] where she had exertional dyspnea with an incline.  Had some anxiety.  --> evaluated with a stress echo. --> Started on Crestor and Zetia (labs followed by PCP)  Recent Hospitalizations: none  Studies Personally Reviewed - (if available, images/films reviewed: From Epic Chart or Care Everywhere)  Stress Echocardiogram April 2019: NORMAL STUDY.  EF 60-65%.  No stress-induced arrhythmias or wall motion abnormalities.  Normal EKG.  Interval History: Cindy [name] is here to see me for the first time - transferring care from her prior Cardiologist (for easier accessibility).  She indicates that she is doing quite well -- as of right now, her RA seems to be under control (but she is concerned about the systemic inflammation & CAD complications).  She usually walks ~ 2 miles daily & does water exercises with some strengthening exercises. 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 She says at the current dose of Toprol 20 really good job keeping the palpitations down.  Very rarely has to take an occasional extra dose.  Cardiovascular ROS: no chest pain or dyspnea on exertion negative for - edema, irregular heartbeat, orthopnea, palpitations, paroxysmal nocturnal dyspnea, rapid  heart rate, shortness of breath or syncope/near syncope or TIA/amaurosis fugax symptoms, claudication,   --> She recently changed from Humira to Enbrel, and is hoping that this will get her RA under control a little better.  ROS: A comprehensive was performed. The patient does not have symptoms concerning for COVID-19 infection (fever, chills, cough, or new shortness of breath).  Chronic dry hacking cough (not as much lately) The patient is practicing social distancing.  Review of Systems  Constitutional: Negative for malaise/fatigue and weight loss.  HENT: Negative for congestion.   Respiratory: Negative for shortness of breath.   Gastrointestinal: Positive for heartburn (She denies dizziness presyncope.  Depending on what she eats.  Usually it happens if she eats red for she goes to bed.). Negative for blood in stool and melena.  Genitourinary: Negative for hematuria.  Musculoskeletal: Positive for joint pain (MIP hand joints, wrist & ankles - most affected by RA).  Neurological: Negative for dizziness and weakness.  Endo/Heme/Allergies: Does not bruise/bleed easily.  Psychiatric/Behavioral: Negative for depression and memory loss. The patient has insomnia (RLS). The patient is not nervous/anxious.   All other systems reviewed and are negative.  COVID-19 Education: The signs and symptoms of COVID-19 were discussed with the patient and how to seek care for testing (follow up with PCP or arrange E-visit).   The importance of social distancing was discussed today.  I have reviewed and (if needed) personally updated the patient's problem list, medications, allergies, past medical and surgical history, social and family history.   Past Medical  History:  Diagnosis Date   Anemia    Anginal pain ([place])    Anxiety    Arthritis    CAD S/P percutaneous coronary angioplasty 2006   s/p BMS PCI in 2006 - Dx (3.0x46m Driver BMS);  CATH 89/32 mDx ~ 20% ISR, EF 65%; NORMAL STUDY.  EF 60-65%.   No stress-induced arrhythmias or wall motion abnormalities.  Normal EKG.   Depression    Dyslipidemia    Dysrhythmia    tachycardia if she misses Toprol   GERD (gastroesophageal reflux disease)    Hyperlipidemia    Hypertension    Hypothyroidism    Osteoarthritis    Shoulder and hip   Pneumonia    Uterine cancer ([facility]     Past Surgical History:  Procedure Laterality Date   ABDOMINAL HYSTERECTOMY     COLONOSCOPY     CORONARY STENT INTERVENTION  2006   BMS PCI of Diag: 3.0x157mDriver BMS   LEFT HEART CATH AND CORONARY ANGIOGRAPHY  2006   -> Resulted in PCI of Diag   LEFT HEART CATH AND CORONARY ANGIOGRAPHY  11/2010    mDx stent ok with 20% ISR, EF 65%;    POSTERIOR LUMBAR FUSION  02/23/2015   L4   L5   STRESS ECHOCARDIOGRAM  07/2017   NORMAL STUDY.  EF 60-65%.  No stress-induced arrhythmias or wall motion abnormalities.  Normal EKG.   TONSILLECTOMY AND ADENOIDECTOMY     TOTAL ABDOMINAL HYSTERECTOMY W/ BILATERAL SALPINGOOPHORECTOMY  2008   UPPER GASTROINTESTINAL ENDOSCOPY      Current Meds  Medication Sig   ALPRAZolam (XANAX) 0.5 MG tablet Take 0.5 tablets by mouth as needed. As needed for anxiety   aspirin EC 325 MG tablet Take 325 mg by mouth daily.     Cholecalciferol (D3-1000) 25 MCG (1000 UT) capsule Take 1,000 Units by mouth daily.   ENBREL SURECLICK 50 MG/ML injection Inject 50 mg as directed once a week.   ezetimibe (ZETIA) 10 MG tablet Take 1 tablet (10 mg total) by mouth daily.   folic acid (FOLVITE) 0.5 MG tablet Take 0.5 mg by mouth daily.   gabapentin (NEURONTIN) 300 MG capsule Take 1 capsule by mouth daily as needed (tingling). As needed for tingling   lisinopril (PRINIVIL,ZESTRIL) 20 MG tablet Take 1 tablet (20 mg total) by mouth daily.   methotrexate (RHEUMATREX) 2.5 MG tablet Take 2.5 mg by mouth once a week. Caution:Chemotherapy. Protect from light.   metoprolol (TOPROL-XL) 50 MG 24 hr tablet Take 1 tablet (50 mg total) by  mouth daily.   nitroGLYCERIN (NITROSTAT) 0.4 MG SL tablet Place 0.4 mg under the tongue every 5 (five) minutes as needed for chest pain.   pregabalin (LYRICA) 50 MG capsule Take 50 mg by mouth 2 (two) times daily.   rosuvastatin (CRESTOR) 20 MG tablet Take 20 mg by mouth daily.   sertraline (ZOLOFT) 50 MG tablet Take 50 mg by mouth daily.   SYNTHROID 100 MCG tablet Take 100 mcg by mouth daily.     Allergies  Allergen Reactions   Morphine And Related Itching    Social History   Tobacco Use   Smoking status: Never Smoker   Smokeless tobacco: Never Used   Tobacco comment: Tobacco use-no  Substance Use Topics   Alcohol use: Yes    Comment: Social   Drug use: No   Social History   Social History Narrative   [place]   family history includes Diabetes  in her mother; Heart attack (age of onset: 74) in her mother; Heart disease in her maternal grandmother.  Wt Readings from Last 3 Encounters:  12/24/18 170 lb 9.6 oz (77.4 kg)  07/01/17 175 lb 1.9 oz (79.4 kg)  03/14/15 170 lb (77.1 kg)    PHYSICAL EXAM BP (!) 154/89    Pulse 62    Ht _0  (1.651 m)    Wt 170 lb 9.6 oz (77.4 kg)    SpO2 100%    BMI 28.39 kg/m  Physical Exam  Constitutional: She is oriented to person, place, and time. She appears well-developed and well-nourished. No distress.  Well-groomed.  Healthy-appearing.  HENT:  Head: Normocephalic and atraumatic.  Eyes: Pupils are equal, round, and reactive to light. EOM are normal.  Neck: Normal range of motion. Neck supple. No hepatojugular reflux and no JVD present. Carotid bruit is not present.  Cardiovascular: Normal rate, regular rhythm, normal heart sounds and normal pulses.  No extrasystoles are present. PMI is not displaced. Exam reveals no gallop and no friction rub.  No murmur heard. Pulmonary/Chest: Effort normal and breath sounds normal. No respiratory distress. She has no wheezes. She has no rales.    Abdominal: Soft. Bowel sounds are normal. She exhibits no distension. There is no abdominal tenderness. There is no rebound.  No HSM  Musculoskeletal: Normal range of motion.        General: No edema (She does have some mild wrist and ankle swelling, but no real edema).     Comments: Bilateral hand MIP joint mild nodular swelling and tenderness  Neurological: She is alert and oriented to person, place, and time.  Psychiatric: She has a normal mood and affect. Her behavior is normal. Judgment and thought content normal.  Vitals reviewed.   Adult ECG Report  Rate: 62 ;  Rhythm: normal sinus rhythm and Normal axis, intervals and durations.;   Narrative Interpretation: Normal EKG   Other studies Reviewed: Additional studies/ records that were reviewed today include:  Recent Labs:  09/11/2017 - LDL 93  12/03/2018 - Na+ 142, K+ 4.4, Cl 104, CO2 24, BUN 13, Cr 0.66, Glu 103 CA +9.4.  AST 21, ALT 60, alk phos 55 Due to have labs from PCP in Oct, 2020 with Lipid Panel   ASSESSMENT / PLAN: Problem List Items Addressed This Visit    Hyperlipidemia with target LDL less than 70 (Chronic)    Goal LDL is less than 70, and by recent check she was at their current dose of 20 mg Crestor and Zetia.  She is due to have labs checked next month.  I would like to see how she is doing with altered exercise and diet.  We could consider titrating up rosuvastatin, or potentially adding Bempedoic Acid (Nexletol).  If close      Relevant Orders   EKG 12-Lead (Completed)   Lipid panel   Essential hypertension (Chronic)    Blood pressure has been well controlled little high today.  She tells me at home it is usually much better than this.  Just this morning was 126/70  MmHg.  For now we will monitor and leave her on current dose of lisinopril and Toprol.      Relevant Orders   EKG 12-Lead (Completed)   CAD - BMS PCI Diag - Primary (Chronic)    Stable with no active anginal symptoms with a decent amount of  activity.  Negative stress test last year.    Currently on high-dose aspirin (  can probably reduce to maintenance dose of 81 mg ->not sure the reason why she is on high-dose) Is on modest dose of Toprol and lisinopril as well as rosuvastatin.  Also on Zetia.      Relevant Orders   EKG 12-Lead (Completed)   Lipid panel      I spent a total of 28 point minutes with the patient and chart review. >  50% of the time was spent in direct patient consultation.   Current medicines are reviewed at length with the patient today.  (+/- concerns) none The following changes have been made:  none  Patient Instructions  Medication Instructions:  NO CHANGES    Lab work: PLEASE HAVE PRIMARY TO SEND LABS- IN OCT TO THE OFFICE - LIPIDS  Testing/Procedures: NOT NEEDED  Follow-Up: At [facility], you and your health needs are our priority.  As part of our continuing mission to provide you with exceptional heart care, we have created designated Provider Care Teams.  These Care Teams include your primary Cardiologist (physician) and Advanced Practice Providers (APPs -  Physician Assistants and Nurse Practitioners) who all work together to provide you with the care you need, when you need it.  You will need a follow up appointment in 12 months -SEPT 2021.  Please call our office 2 months in advance to schedule this appointment.  You may see Glenetta Hew, MD or one of the following Advanced Practice Providers on your designated Care Team:    Rosaria Ferries, PA-C  Jory Sims, DNP, ANP  Any Other Special Instructions Will Be Listed Below (If Applicable).    Studies Ordered:   Orders Placed This Encounter  Procedures   Lipid panel   EKG 12-Lead      Glenetta Hew, M.D., M.S. Interventional Cardiologist   Pager # [phone] Phone # [phone] [address] [place], [place] [address]   Thank you for choosing Heartcare at [facility]!!

## 2018-12-24 NOTE — Patient Instructions (Addendum)
Medication Instructions:  NO CHANGES    Lab work: PLEASE HAVE PRIMARY TO SEND LABS- IN OCT TO THE OFFICE - LIPIDS  Testing/Procedures: NOT NEEDED  Follow-Up: At [facility], you and your health needs are our priority.  As part of our continuing mission to provide you with exceptional heart care, we have created designated Provider Care Teams.  These Care Teams include your primary Cardiologist (physician) and Advanced Practice Providers (APPs -  Physician Assistants and Nurse Practitioners) who all work together to provide you with the care you need, when you need it. . You will need a follow up appointment in 12 months -SEPT 2021.  Please call our office 2 months in advance to schedule this appointment.  You may see Glenetta Hew, MD or one of the following Advanced Practice Providers on your designated Care Team:   . Rosaria Ferries, PA-C . Jory Sims, DNP, ANP  Any Other Special Instructions Will Be Listed Below (If Applicable).

## 2018-12-26 ENCOUNTER — Encounter: Payer: Self-pay | Admitting: Cardiology

## 2018-12-26 NOTE — Assessment & Plan Note (Signed)
Goal LDL is less than 70, and by recent check she was at their current dose of 20 mg Crestor and Zetia.  She is due to have labs checked next month.  I would like to see how she is doing with altered exercise and diet.  We could consider titrating up rosuvastatin, or potentially adding Bempedoic Acid (Nexletol).  If close

## 2018-12-26 NOTE — Assessment & Plan Note (Signed)
Stable with no active anginal symptoms with a decent amount of activity.  Negative stress test last year.    Currently on high-dose aspirin (can probably reduce to maintenance dose of 81 mg ->not sure the reason why she is on high-dose) Is on modest dose of Toprol and lisinopril as well as rosuvastatin.  Also on Zetia.

## 2018-12-26 NOTE — Assessment & Plan Note (Signed)
Blood pressure has been well controlled little high today.  She tells me at home it is usually much better than this.  Just this morning was 126/70  MmHg.  For now we will monitor and leave her on current dose of lisinopril and Toprol.

## 2019-02-25 ENCOUNTER — Other Ambulatory Visit: Payer: Self-pay | Admitting: Internal Medicine

## 2019-02-25 DIAGNOSIS — Z1231 Encounter for screening mammogram for malignant neoplasm of breast: Secondary | ICD-10-CM

## 2019-04-02 ENCOUNTER — Ambulatory Visit (INDEPENDENT_AMBULATORY_CARE_PROVIDER_SITE_OTHER): Payer: 59 | Admitting: Otolaryngology

## 2019-04-21 ENCOUNTER — Other Ambulatory Visit: Payer: Self-pay

## 2019-04-21 ENCOUNTER — Ambulatory Visit
Admission: RE | Admit: 2019-04-21 | Discharge: 2019-04-21 | Disposition: A | Discharge status: Home / Self Care | Payer: 59 | Source: Ambulatory Visit | Attending: Internal Medicine | Admitting: Internal Medicine

## 2019-04-21 DIAGNOSIS — Z1231 Encounter for screening mammogram for malignant neoplasm of breast: Secondary | ICD-10-CM

## 2019-06-23 ENCOUNTER — Other Ambulatory Visit: Payer: Self-pay | Admitting: Otolaryngology

## 2019-06-23 DIAGNOSIS — K111 Hypertrophy of salivary gland: Secondary | ICD-10-CM

## 2019-07-01 ENCOUNTER — Ambulatory Visit
Admission: RE | Admit: 2019-07-01 | Discharge: 2019-07-01 | Disposition: A | Discharge status: Home / Self Care | Payer: 59 | Source: Ambulatory Visit | Attending: Otolaryngology | Admitting: Otolaryngology

## 2019-07-01 DIAGNOSIS — K111 Hypertrophy of salivary gland: Secondary | ICD-10-CM

## 2019-07-01 MED ORDER — IOPAMIDOL (ISOVUE-300) INJECTION 61%
75.0000 mL | Freq: Once | INTRAVENOUS | Status: AC | PRN
  Administered 2019-07-01: 75 mL via INTRAVENOUS

## 2019-09-09 ENCOUNTER — Other Ambulatory Visit: Payer: Self-pay

## 2019-09-09 ENCOUNTER — Ambulatory Visit (INDEPENDENT_AMBULATORY_CARE_PROVIDER_SITE_OTHER): Payer: 59

## 2019-09-09 ENCOUNTER — Encounter: Payer: Self-pay | Admitting: Orthopedic Surgery

## 2019-09-09 ENCOUNTER — Ambulatory Visit: Payer: 59 | Admitting: Orthopedic Surgery

## 2019-09-09 VITALS — Ht 65.0 in | Wt 170.0 lb

## 2019-09-09 DIAGNOSIS — M5416 Radiculopathy, lumbar region: Secondary | ICD-10-CM

## 2019-09-09 DIAGNOSIS — M25552 Pain in left hip: Secondary | ICD-10-CM

## 2019-09-09 MED ORDER — PREDNISONE 10 MG (21) PO TBPK: ORAL_TABLET | ORAL | 0 refills | Status: DC

## 2019-09-09 NOTE — Progress Notes (Signed)
pre

## 2019-09-11 ENCOUNTER — Encounter: Payer: Self-pay | Admitting: Orthopedic Surgery

## 2019-09-11 NOTE — Progress Notes (Signed)
Office Visit Note   Patient: Cindy [name]           Date of Birth: [birth year]           MRN: [id number] Visit Date: 09/09/2019 Requested by: Jani Gravel, MD [address] [place],  [place] [address] PCP: Jani Gravel, MD  Subjective: Chief Complaint  Patient presents with  . Left Hip - Pain    HPI: [name] is a 64 year old patient with left hip pain for 2 weeks.  Denies any history of injury.  Symptoms worse over the past 2 weeks but have been present for about 6 weeks.  Denies any groin pain.  Radiographs from 2018 showed no arthritis of the hips.  Hurts for her to walk at times.  She is taking Aleve and she tried Neurontin and muscle relaxer without relief.  Describes new loss of sensation on the lateral aspect of her left foot.  She has a history of prior back surgery which was a fusion.              ROS: All systems reviewed are negative as they relate to the chief complaint within the history of present illness.  Patient denies  fevers or chills.   Assessment & Plan: Visit Diagnoses:  1. Pain in left hip   2. Left lumbar radiculopathy     Plan: Impression is normal left hip radiographs with history of fusion in the lower lumbar spine.  No groin pain on exam and no real pain with range of motion of that hip.  Plan is Medrol Dosepak 6-day course with MRI lumbar spine to evaluate new left-sided radiculopathy.  Her surgery was primarily for right-sided radiculopathy.  Follow-up with me after that study.  She may need to go back and see Dr. Saintclair Halsted for further evaluation and management.  Follow-Up Instructions: No follow-ups on file.   Orders:  Orders Placed This Encounter  Procedures  . XR HIP UNILAT W OR W/O PELVIS 2-3 VIEWS LEFT  . MR Lumbar Spine w/o contrast   Meds ordered this encounter  Medications  . predniSONE (STERAPRED UNI-PAK 21 TAB) 10 MG (21) TBPK tablet    Sig: Take as directed.    Dispense:  21 tablet    Refill:  0      Procedures: No procedures  performed   Clinical Data: No additional findings.  Objective: Vital Signs: Ht 5\' 5"  (1.651 m)   Wt 170 lb (77.1 kg)   BMI 28.29 kg/m   Physical Exam:   Constitutional: Patient appears well-developed HEENT:  Head: Normocephalic Eyes:EOM are normal Neck: Normal range of motion Cardiovascular: Normal rate Pulmonary/chest: Effort normal Neurologic: Patient is alert Skin: Skin is warm Psychiatric: Patient has normal mood and affect    Ortho Exam: Ortho exam demonstrates mild pain with forward lateral bending.  Incision intact on the back.  No definite nerve root tension signs but no groin pain on the left with internal extra rotation of the leg no restriction of passive range of motion.  Patient has 5 out of 5 ankle dorsiflexion plantarflexion quad hamstring strength with palpable pedal pulses.  No masses lymphadenopathy or skin changes noted in that left hip region.  She does have some paresthesias in the L5 distribution on the left compared to the right.  Reflexes otherwise symmetric.  Specialty Comments:  No specialty comments available.  Imaging: No results found.   PMFS History: Patient Active Problem List   Diagnosis Date Noted  . Pain of  left hip joint 05/04/2016  . Spinal stenosis of lumbar region 02/23/2015  . Hyperlipidemia with target LDL less than 70 02/17/2010  . Essential hypertension 02/17/2010  . CAD - BMS PCI Diag 2006   Past Medical History:  Diagnosis Date  . Anemia   . Anginal pain ([place])   . Anxiety   . Arthritis   . CAD S/P percutaneous coronary angioplasty 2006   s/p BMS PCI in 2006 - Dx (3.0x65mm Driver BMS);  CATH [id number]: mDx ~ 20% ISR, EF 65%; NORMAL STUDY.  EF 60-65%.  No stress-induced arrhythmias or wall motion abnormalities.  Normal EKG.  . Depression   . Dyslipidemia   . Dysrhythmia    tachycardia if she misses Toprol  . GERD (gastroesophageal reflux disease)   . Hyperlipidemia   . Hypertension   . Hypothyroidism   . Osteoarthritis     Shoulder and hip  . Pneumonia   . Uterine cancer ([place])     Family History  Problem Relation Age of Onset  . Heart attack Mother 73  . Diabetes Mother   . Heart disease Maternal Grandmother   . Colon cancer Neg Hx     Past Surgical History:  Procedure Laterality Date  . ABDOMINAL HYSTERECTOMY    . COLONOSCOPY    . CORONARY STENT INTERVENTION  2006   BMS PCI of Diag: 3.0x39mm Driver BMS  . LEFT HEART CATH AND CORONARY ANGIOGRAPHY  2006   -> Resulted in PCI of Diag  . LEFT HEART CATH AND CORONARY ANGIOGRAPHY  11/2010    mDx stent ok with 20% ISR, EF 65%;   . POSTERIOR LUMBAR FUSION  02/23/2015   L4   L5  . STRESS ECHOCARDIOGRAM  07/2017   NORMAL STUDY.  EF 60-65%.  No stress-induced arrhythmias or wall motion abnormalities.  Normal EKG.  . TONSILLECTOMY AND ADENOIDECTOMY    . TOTAL ABDOMINAL HYSTERECTOMY W/ BILATERAL SALPINGOOPHORECTOMY  2008  . UPPER GASTROINTESTINAL ENDOSCOPY     Social History   Occupational History  . Occupation: Government social research officer: [employer]  Tobacco Use  . Smoking status: Never Smoker  . Smokeless tobacco: Never Used  . Tobacco comment: Tobacco use-no  Substance and Sexual Activity  . Alcohol use: Yes    Comment: Social  . Drug use: No  . Sexual activity: Not on file

## 2019-09-15 ENCOUNTER — Encounter: Payer: Self-pay | Admitting: Cardiovascular Disease

## 2019-10-02 ENCOUNTER — Telehealth: Payer: Self-pay | Admitting: Orthopedic Surgery

## 2019-10-02 DIAGNOSIS — M5416 Radiculopathy, lumbar region: Secondary | ICD-10-CM

## 2019-10-02 NOTE — Telephone Encounter (Signed)
Please advise if ok for this. Per your last note mentioned may need to return to Dr Saintclair Halsted for further eval?

## 2019-10-02 NOTE — Telephone Encounter (Signed)
Patient called asked if Dr dean will refer her to Dr Jenne Campus (Neuro Surgeon) in [facility]. [place]. The fax# is [phone]  The Ph# is [phone] The number to contact patient is [phone]

## 2019-10-02 NOTE — Telephone Encounter (Signed)
Okay to send in [place] wherever she wants to go thanks

## 2019-10-05 NOTE — Telephone Encounter (Signed)
faxed

## 2019-10-05 NOTE — Telephone Encounter (Signed)
I did put this in. Do you mind submitting referral?

## 2019-10-07 ENCOUNTER — Other Ambulatory Visit: Payer: 59

## 2019-10-29 ENCOUNTER — Telehealth: Payer: Self-pay | Admitting: Orthopedic Surgery

## 2019-10-29 NOTE — Telephone Encounter (Signed)
Patient called requesting Dr. Marlou Sa send pt referral for MRI to either [facility] or [facility]. Current MRI facility does not except pt [employer]. Please call patient at [phone].

## 2019-10-29 NOTE — Telephone Encounter (Signed)
Please see below. Assume she is referring to scan that was ordered back in May?

## 2019-11-09 NOTE — Telephone Encounter (Signed)
Called pt left vm to return call

## 2019-11-19 DIAGNOSIS — M5416 Radiculopathy, lumbar region: Secondary | ICD-10-CM | POA: Insufficient documentation

## 2019-12-01 ENCOUNTER — Telehealth: Payer: Self-pay | Admitting: *Deleted

## 2019-12-01 NOTE — Telephone Encounter (Signed)
A message was left, re: her follow up visit. 

## 2019-12-10 ENCOUNTER — Telehealth: Payer: Self-pay | Admitting: Cardiology

## 2019-12-10 NOTE — Telephone Encounter (Signed)
LVM for patient to return call to get follow up scheduled with [facility] from recall list 

## 2019-12-15 ENCOUNTER — Telehealth: Payer: Self-pay | Admitting: *Deleted

## 2019-12-15 NOTE — Telephone Encounter (Signed)
Error

## 2019-12-15 NOTE — Telephone Encounter (Signed)
A message was left, re: her follow up visit. 

## 2020-01-06 DIAGNOSIS — Z981 Arthrodesis status: Secondary | ICD-10-CM | POA: Insufficient documentation

## 2020-01-21 DIAGNOSIS — M069 Rheumatoid arthritis, unspecified: Secondary | ICD-10-CM | POA: Insufficient documentation

## 2020-01-21 DIAGNOSIS — I2510 Atherosclerotic heart disease of native coronary artery without angina pectoris: Secondary | ICD-10-CM | POA: Insufficient documentation

## 2020-01-21 DIAGNOSIS — E039 Hypothyroidism, unspecified: Secondary | ICD-10-CM | POA: Insufficient documentation

## 2020-02-11 IMAGING — MG DIGITAL SCREENING BILAT W/ CAD
4 series · 4 of 4 positions shown · non-contrast
Comparison: None.

CLINICAL DATA: Screening.

EXAM:
DIGITAL SCREENING BILATERAL MAMMOGRAM WITH CAD

[R CC]
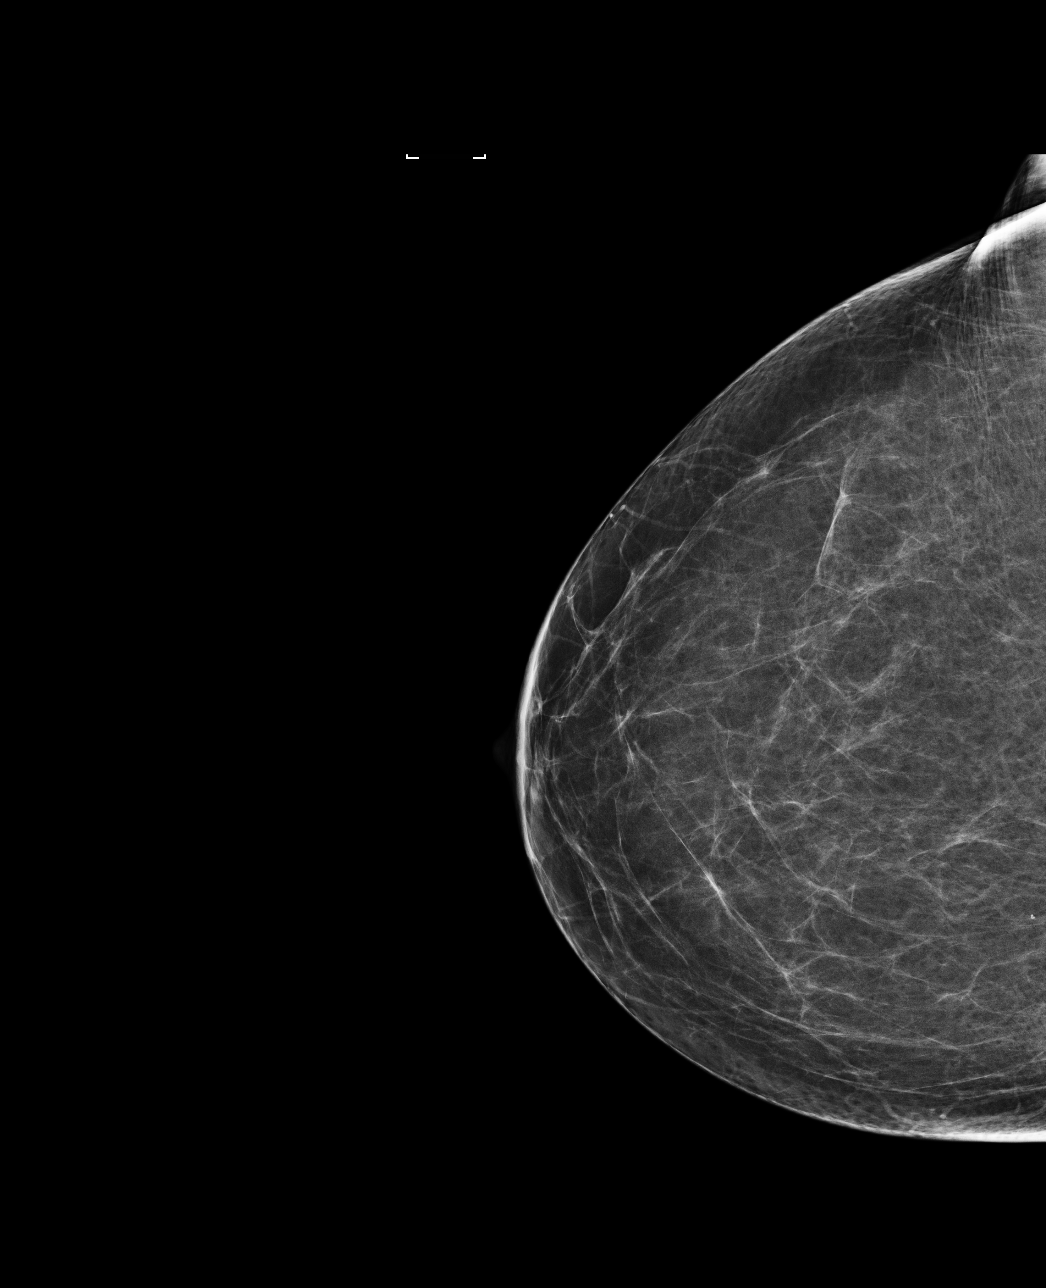

[L CC]
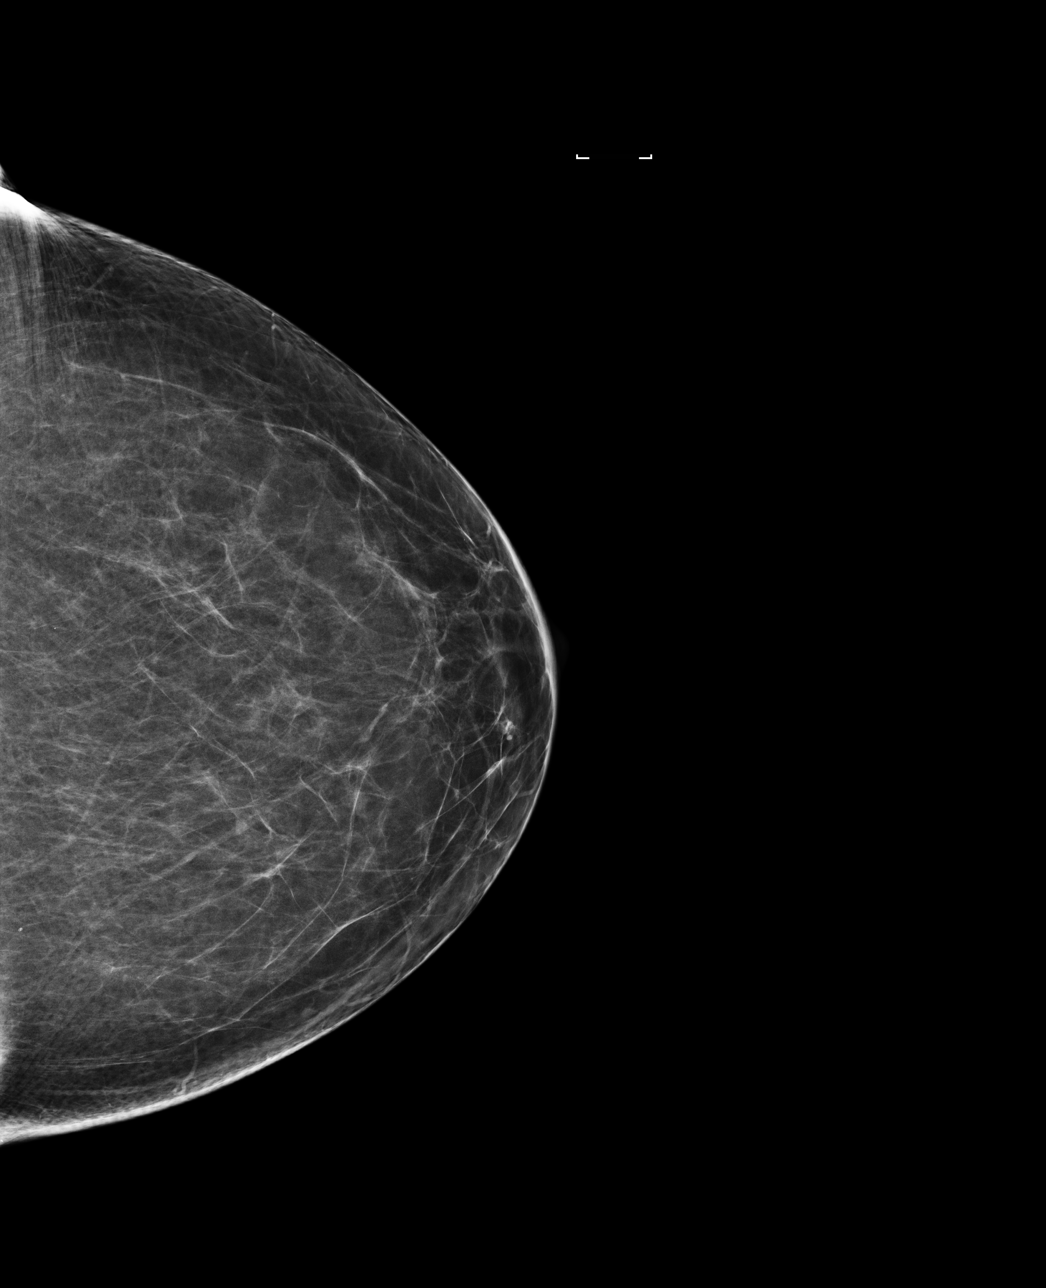

[L MLO]
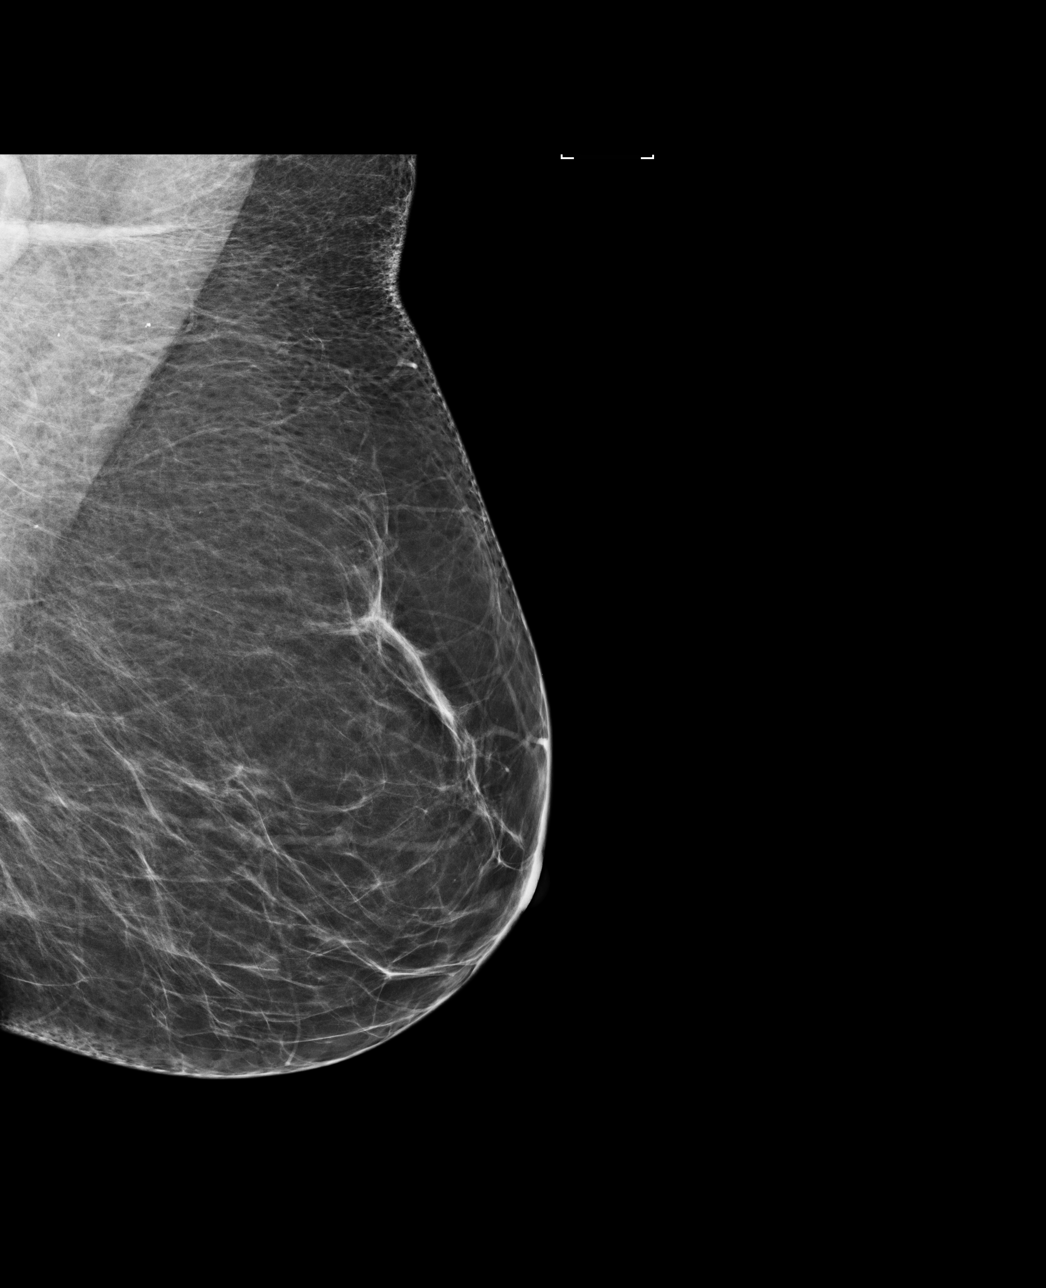

[R MLO]
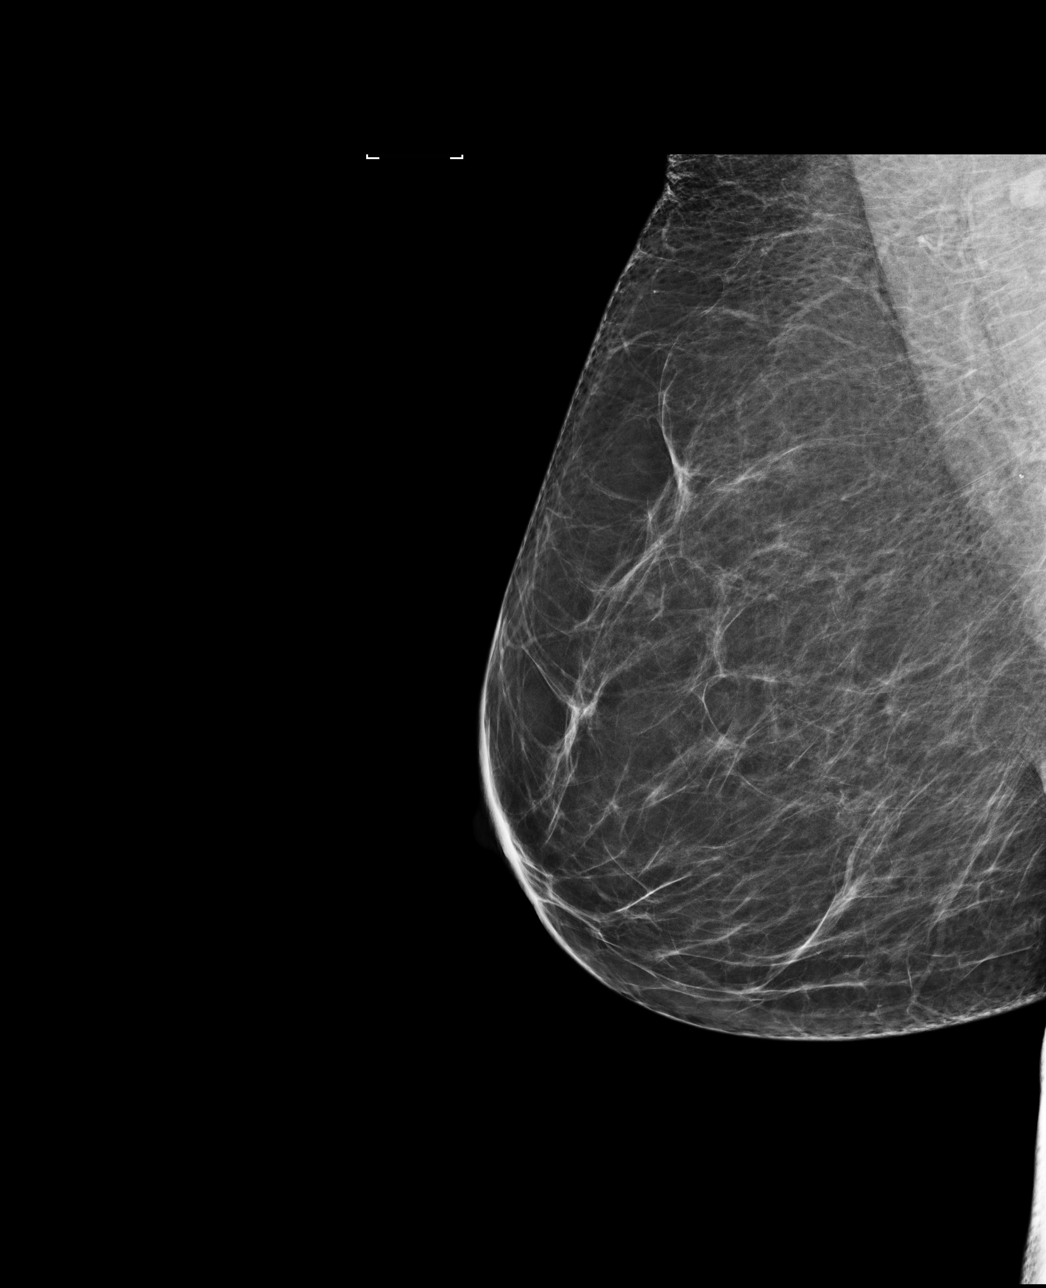

[4 of 4 positions shown; findings below may reference images not displayed]

ACR Breast Density Category b: There are scattered areas of
fibroglandular density.
FINDINGS: There are no findings suspicious for malignancy. Images were
processed with CAD.
IMPRESSION: No mammographic evidence of malignancy. A result letter of this
screening mammogram will be mailed directly to the patient.

RECOMMENDATION:
Screening mammogram in one year. (Code:[id number])

BI-RADS CATEGORY  1: Negative.

## 2020-03-22 DIAGNOSIS — Z9889 Other specified postprocedural states: Secondary | ICD-10-CM | POA: Insufficient documentation

## 2020-05-25 DIAGNOSIS — M545 Low back pain, unspecified: Secondary | ICD-10-CM | POA: Insufficient documentation

## 2020-08-03 DIAGNOSIS — M25559 Pain in unspecified hip: Secondary | ICD-10-CM | POA: Insufficient documentation

## 2020-08-03 DIAGNOSIS — F32A Depression, unspecified: Secondary | ICD-10-CM | POA: Insufficient documentation

## 2020-09-21 DIAGNOSIS — M25571 Pain in right ankle and joints of right foot: Secondary | ICD-10-CM | POA: Insufficient documentation

## 2020-09-21 DIAGNOSIS — M25572 Pain in left ankle and joints of left foot: Secondary | ICD-10-CM | POA: Insufficient documentation

## 2020-11-02 DIAGNOSIS — M19071 Primary osteoarthritis, right ankle and foot: Secondary | ICD-10-CM | POA: Insufficient documentation

## 2021-03-29 DIAGNOSIS — Z808 Family history of malignant neoplasm of other organs or systems: Secondary | ICD-10-CM | POA: Insufficient documentation

## 2021-09-12 DIAGNOSIS — M79671 Pain in right foot: Secondary | ICD-10-CM | POA: Insufficient documentation

## 2021-09-12 DIAGNOSIS — M205X2 Other deformities of toe(s) (acquired), left foot: Secondary | ICD-10-CM | POA: Insufficient documentation

## 2022-03-20 DIAGNOSIS — N816 Rectocele: Secondary | ICD-10-CM | POA: Insufficient documentation

## 2022-03-20 DIAGNOSIS — N993 Prolapse of vaginal vault after hysterectomy: Secondary | ICD-10-CM | POA: Insufficient documentation

## 2022-03-20 DIAGNOSIS — N8111 Cystocele, midline: Secondary | ICD-10-CM | POA: Insufficient documentation

## 2022-04-23 HISTORY — PX: COLONOSCOPY: SHX174

## 2022-04-23 HISTORY — PX: UPPER GASTROINTESTINAL ENDOSCOPY: SHX188

## 2022-08-13 ENCOUNTER — Encounter: Payer: Self-pay | Admitting: *Deleted

## 2022-08-14 ENCOUNTER — Ambulatory Visit: Payer: Medicare Other | Admitting: Neurology

## 2022-08-14 ENCOUNTER — Encounter: Payer: Self-pay | Admitting: Neurology

## 2022-08-14 VITALS — BP 130/79 | HR 74 | Ht 65.5 in | Wt 174.2 lb

## 2022-08-14 DIAGNOSIS — H538 Other visual disturbances: Secondary | ICD-10-CM

## 2022-08-14 DIAGNOSIS — R9082 White matter disease, unspecified: Secondary | ICD-10-CM

## 2022-08-14 DIAGNOSIS — R351 Nocturia: Secondary | ICD-10-CM

## 2022-08-14 DIAGNOSIS — M0579 Rheumatoid arthritis with rheumatoid factor of multiple sites without organ or systems involvement: Secondary | ICD-10-CM | POA: Diagnosis not present

## 2022-08-14 DIAGNOSIS — R0683 Snoring: Secondary | ICD-10-CM

## 2022-08-14 DIAGNOSIS — R519 Headache, unspecified: Secondary | ICD-10-CM

## 2022-08-14 NOTE — Progress Notes (Signed)
Subjective:    Patient ID: Cindy [name] is a 67 y.o. female.  HPI    Huston Foley, MD, PhD [facility] [address] [address] [place], [place] [address]  Dear Dr. Vivi Ferns,  I saw your patient, Cindy [name] upon your kind request in my neurologic clinic today for initial consultation of her recurrent headaches.  The patient is unaccompanied today.  As you know, Cindy [name] is a 67 year old female with an underlying complex medical history of anemia, arthritis, including rheumatoid arthritis and osteoarthritis, on Remicade infusions, hypertension, hyperlipidemia, reflux disease, depression, anxiety, coronary artery disease with status post angioplasty, history of uterine cancer, low back pain with status post lumbar discectomy in 2021 and lumbar spine fusion in 2023 pneumonia, and overweight state, who reports an approximately 21-month history of recurrent headaches affecting primarily her left side, the headache is not particularly severe, feels like a pressure or sometimes sharp, it feels like a soreness on the head, typically on the top left side of the head, sometimes radiating to the temple area and behind the eyes bilaterally.  She has associated nausea but the nausea seems to be independent of the headaches.  She has some light sensitivity but not all the time, no history of migraines, no family history of migraines, no family history of sleep apnea, no obvious trigger, sometimes it helps to massage the temple area although it increases the soreness initially but then relieves it.  She has been taking nearly daily Advil or Excedrin, most recently daily Excedrin, usually 1 pill in the midday.  She snores, her husband is on speaker phone and denies any apneas.  She has sleep interruption from nocturia about twice per night and wakes up often with a headache in the mornings.  She has not had any sudden onset of one-sided weakness or numbness or tingling or droopy face or  slurring of speech. She does not drink daily caffeine, she does not drink alcohol regularly, she is a non-smoker. She does sleep about 10 to 12 hours in any given day.  Goes to bed around 8:30 PM or 9 PM and falls asleep by 10 PM.  She sleeps till 10 AM often. She has noticed about a month or 2 ago swelling of the left foot.  She has had a recent change in her Lyrica dose.  The swelling is painful in the left foot and has subsided some but it was significantly swollen initially.  No injury to the left ankle.  She is followed regularly by cardiology.  She was told by her ophthalmologist that she did not have any vision changes and no new glasses were provided.  She does have a history of cataracts which they are watching.   I reviewed your office note from 06/19/2022.  She reported a left-sided headache for the past month. She reported a remote history of shingles on the left side of the scalp.  She had elevated blood pressure values.  She had trouble focusing and problems with her eyes.  She saw an eye doctor in December 2023.  Of note, she is on multiple medications including duloxetine 60 mg daily, and Lyrica 75 mg strength in the morning and 150 mg at bedtime, also Xanax for anxiety or sleep, 0.5 mg strength 1 pill twice daily as needed.  Of note, she is also on methotrexate and gets Remicade infusions.  Full list of medications below.  She had blood work through your office on 06/19/2022 and I reviewed  the results.  She had a CBC with differential, CMP, TSH, magnesium, results were benign with the exception of mildly elevated BUN at 25, blood sugar was 111, creatinine 1.06.  RBC was low normal at 3.98, hemoglobin slightly below normal at 12.1. In 2020 her CRP was low, no recent CRP. Anti CCP low in 2021.  She had a brain MRI w/wo contrast through [facility] on 08/01/22 and I reviewed the results: IMPRESSION:   1. No acute intracranial abnormality.  2.  Few scattered periventricular and subcortical  T2/FLAIR hyperintense white matter lesions are abnormal but nonspecific, typically attributed to prior trauma/inflammation/demyelination, or chronic ischemia associated with migraine/atherosclerosis.   Her Past Medical History Is Significant For: Past Medical History:  Diagnosis Date   Anemia    Anginal pain    Anxiety    Arthritis    CAD S/P percutaneous coronary angioplasty 2006   s/p BMS PCI in 2006 - Dx (3.0x76mm Driver BMS);  CATH 0/27: mDx ~ 20% ISR, EF 65%; NORMAL STUDY.  EF 60-65%.  No stress-induced arrhythmias or wall motion abnormalities.  Normal EKG.   Depression    Dyslipidemia    Dysrhythmia    tachycardia if she misses Toprol   GERD (gastroesophageal reflux disease)    Hyperlipidemia    Hypertension    Hypothyroidism    Osteoarthritis    Shoulder and hip   Pneumonia    Uterine cancer     Her Past Surgical History Is Significant For: Past Surgical History:  Procedure Laterality Date   ABDOMINAL HYSTERECTOMY     atrial valve surgery     05/2022   BACK SURGERY     [facility], and 2021 (S1, and slipped disc)   COLONOSCOPY     CORONARY STENT INTERVENTION  2006   BMS PCI of Diag: 3.0x53mm Driver BMS   LEFT HEART CATH AND CORONARY ANGIOGRAPHY  2006   -> Resulted in PCI of Diag   LEFT HEART CATH AND CORONARY ANGIOGRAPHY  11/2010    mDx stent ok with 20% ISR, EF 65%;    POSTERIOR LUMBAR FUSION  02/23/2015   L4   L5   STRESS ECHOCARDIOGRAM  07/2017   NORMAL STUDY.  EF 60-65%.  No stress-induced arrhythmias or wall motion abnormalities.  Normal EKG.   TONSILLECTOMY AND ADENOIDECTOMY     TOTAL ABDOMINAL HYSTERECTOMY W/ BILATERAL SALPINGOOPHORECTOMY  2008   UPPER GASTROINTESTINAL ENDOSCOPY      Her Family History Is Significant For: Family History  Problem Relation Age of Onset   Heart attack Mother 67   Diabetes Mother    Rheum arthritis Mother    Glaucoma Sister    Cancer Sister    Lymphoma Sister    Melanoma Sister    Heart disease Maternal  Grandmother    Colon cancer Neg Hx     Her Social History Is Significant For: Social History   Socioeconomic History   Marital status: Married    Spouse name: Not on file   Number of children: 2   Years of education: Not on file   Highest education level: Not on file  Occupational History   Occupation: Optician, dispensing: [employer]  Tobacco Use   Smoking status: Never   Smokeless tobacco: Never   Tobacco comments:    Tobacco use-no  Vaping Use   Vaping Use: Never used  Substance and Sexual Activity   Alcohol use: Yes    Comment: Social   Drug use: No  Sexual activity: Not on file  Other Topics Concern   Not on file  Social History Narrative    Moved to [place] from [place].    Caffiene occasionally.   Social Determinants of Health   Financial Resource Strain: Not on file  Food Insecurity: Not on file  Transportation Needs: Not on file  Physical Activity: Not on file  Stress: Not on file  Social Connections: Not on file    Her Allergies Are:  Allergies  Allergen Reactions   Amlodipine Swelling    Location of swelling unknown   Morphine And Related Itching  :   Her Current Medications Are:  Outpatient Encounter Medications as of 08/14/2022  Medication Sig   albuterol (VENTOLIN HFA) 108 (90 Base) MCG/ACT inhaler Inhale 2 puffs into the lungs every 6 (six) hours as needed for wheezing or shortness of breath.   ALPRAZolam (XANAX) 0.5 MG tablet Take 0.5 tablets by mouth as needed. As needed for anxiety   aspirin EC 81 MG tablet Take 81 mg by mouth daily. Swallow whole.   Cholecalciferol (D3-1000) 25 MCG (1000 UT) capsule Take 1,000 Units by mouth daily.   DULoxetine (CYMBALTA) 60 MG capsule Take 60 mg by mouth daily.   ezetimibe (ZETIA) 10 MG tablet Take 1 tablet (10 mg total) by mouth daily.   folic acid (FOLVITE) 1 MG tablet Take 1 mg by mouth daily.   hydrochlorothiazide (HYDRODIURIL) 25 MG tablet Take 25 mg by mouth daily.   inFLIXimab (REMICADE IV)  Inject into the vein. Every 7 weeks   levothyroxine (SYNTHROID) 88 MCG tablet Take 88 mcg by mouth daily before breakfast.   lisinopril (ZESTRIL) 40 MG tablet Take 40 mg by mouth daily.   magnesium oxide (MAG-OX) 400 (240 Mg) MG tablet Take 400 mg by mouth daily.   methotrexate (RHEUMATREX) 2.5 MG tablet Take 2.5 mg by mouth once a week. Caution:Chemotherapy. Protect from light.   Multiple Vitamins-Minerals (PRESERVISION AREDS PO) Take 1 tablet by mouth daily.   nitroGLYCERIN (NITROSTAT) 0.4 MG SL tablet Place 0.4 mg under the tongue every 5 (five) minutes as needed for chest pain.   pregabalin (LYRICA) 75 MG capsule Take by mouth. Take 1 cap in am and 2 caps in pm.   Propylene Glycol (SYSTANE BALANCE) 0.6 % SOLN Apply 2 drops to eye daily.   rosuvastatin (CRESTOR) 40 MG tablet Take 40 mg by mouth daily.   VITAMIN E PO Take 1 capsule by mouth daily.   [DISCONTINUED] aspirin EC 325 MG tablet Take 325 mg by mouth daily.     diclofenac Sodium (VOLTAREN) 1 % GEL Apply 2 g topically 2 (two) times daily.   No facility-administered encounter medications on file as of 08/14/2022.  :   Review of Systems:  Out of a complete 14 point review of systems, all are reviewed and negative with the exception of these symptoms as listed below:   Review of Systems  Neurological:        L sided headache, radiates to L ear.  Had MRI, T2 flair white matter ordered by pcp.  Also stated blurry vision/pain bil eyes L>R. When has head pain.     Objective:  Neurological Exam  Physical Exam Physical Examination:   Vitals:   08/14/22 0801  BP: 130/79  Pulse: 74    General Examination: The patient is a very pleasant 67 y.o. female in no acute distress. She appears well-developed and well-nourished and well groomed.   HEENT: Normocephalic, atraumatic, pupils are  equal, round and reactive to light, extraocular tracking is good without limitation to gaze excursion or nystagmus noted. Hearing is grossly intact.  Face is symmetric with normal facial animation. Speech is clear with no dysarthria noted. There is no hypophonia. There is no lip, neck/head, jaw or voice tremor. Neck is supple with full range of passive and active motion. There are no carotid bruits on auscultation. Oropharynx exam reveals: mild mouth dryness, adequate dental hygiene and moderate airway crowding, due to small airway.  Tonsils absent, uvula on the small side, Mallampati class III. Tongue protrudes centrally and palate elevates symmetrically.  No significant soreness in the temples, particularly on the left side, no palpable cord.  Chest: Clear to auscultation without wheezing, rhonchi or crackles noted.  Heart: S1+S2+0, regular and normal without murmurs, rubs or gallops noted.   Abdomen: Soft, non-tender and non-distended.  Extremities: There is trace to 1+ edema in the left foot and ankle area, no significant swelling on the right.   Skin: Warm and dry without trophic changes noted.   Musculoskeletal: exam reveals arthritic changes in both hands, right more than left, left ankle swelling, mild bilateral knee swelling noted.   Neurologically:  Mental status: The patient is awake, alert and oriented in all 4 spheres. Her immediate and remote memory, attention, language skills and fund of knowledge are appropriate. There is no evidence of aphasia, agnosia, apraxia or anomia. Speech is clear with normal prosody and enunciation. Thought process is linear. Mood is normal and affect is normal.  Cranial nerves II - XII are as described above under HEENT exam.  Motor exam: Normal bulk, strength and tone is noted. There is no obvious action or resting tremor.  No postural tremor, no drift or rebound. Fine motor skills and coordination: intact finger taps, hand movements and rapid alternating padding with the upper extremities, intact foot taps bilaterally in the lower extremities.    Cerebellar testing: No dysmetria or intention tremor.  There is no truncal or gait ataxia.  Normal finger-to-nose, normal heel-to-shin with the right leg, difficulty with the left leg due to decreased range of motion, due to low back pain. Sensory exam: intact to light touch in the upper and lower extremities.  Gait, station and balance: She stands easily. No veering to one side is noted. No leaning to one side is noted. Posture is age-appropriate and stance is narrow based. Gait shows normal stride length and normal pace. No problems turning are noted.  Romberg is negative. Reflexes are 1+ in the upper extremities and trace in the lower extremities, toes are downgoing bilaterally.  Tandem walk is difficult for her.  Assessment and Plan:  In summary, Cindy [name] is a very pleasant 67 y.o.-year old female with an underlying complex medical history of anemia, arthritis, including rheumatoid arthritis and osteoarthritis, on Remicade infusions, hypertension, hyperlipidemia, reflux disease, depression, anxiety, coronary artery disease with status post angioplasty, history of uterine cancer, low back pain with status post lumbar discectomy in 2021 and lumbar spine fusion in 2023 pneumonia, and overweight state, who presents for evaluation of her recurrent headaches of about 6+ months duration.  She has a nonfocal neurological exam, history is not compelling for migraine headaches, not compelling for tension headaches or cervicogenic headaches.  Differential diagnosis still includes migraine headache, temporal arteritis, recurrent headaches from underlying sleep apnea, medication overuse headaches, side effects from other medications etc.  We mutually agreed not to add any new medication at this time as she is already on  Lyrica and Cymbalta.  She had tried gabapentin for back pain in the past but is no longer on it.  She is advised to stay well-hydrated with water and limit her caffeine.  She is advised not to take any over-the-counter anti-inflammatory medication on a  daily basis, particularly Excedrin or Aleve or Advil.  She is advised to proceed with further testing in the form of MR angiogram of her head, sleep study, and additional blood work with his CRP and ESR.  These inflammatory markers may be suppressed because of her current medication regimen.  We may consider a temporal artery biopsy next.  I explained this to her at length.  This was a lengthy visit of over 1 hour due to several problems addressed and significant counseling and coordination of care.  We will plan to follow-up after testing and we will keep her posted as to her brain MR angiogram results, lab results and sleep study results by phone call.  If she has obstructive sleep apnea, she would be willing to try PAP therapy.  I answered all her questions today and the patient and her husband on speaker phone were in agreement with the plan.   Thank you very much for allowing me to participate in the care of this nice patient. If I can be of any further assistance to you please do not hesitate to call me at [phone].  Sincerely,   Huston Foley, MD, PhD

## 2022-08-15 LAB — C-REACTIVE PROTEIN: CRP: 2 mg/L (ref 0–10)

## 2022-08-15 LAB — SEDIMENTATION RATE: Sed Rate: 8 mm/hr (ref 0–40)

## 2022-08-20 ENCOUNTER — Telehealth: Payer: Self-pay | Admitting: Neurology

## 2022-08-20 NOTE — Telephone Encounter (Signed)
Cindy [name]: [id number] exp. 08/15/22-09/13/22 sent to [facility] in [place] per patient request [phone]

## 2022-08-28 NOTE — Telephone Encounter (Signed)
[facility] asked [place] to change the MRA order to MRA head WO contrast. Can you please put in the new order and print and sign it and I will send it to them? Thanks!

## 2022-08-28 NOTE — Addendum Note (Signed)
Addended by: Huston Foley on: 08/28/2022 04:31 PM   Modules accepted: Orders

## 2022-08-28 NOTE — Telephone Encounter (Signed)
Order changed for MRI br wo contrast.

## 2022-08-30 NOTE — Telephone Encounter (Signed)
New order sent.

## 2022-09-05 ENCOUNTER — Telehealth: Payer: Self-pay | Admitting: Neurology

## 2022-09-05 NOTE — Telephone Encounter (Signed)
[employer] medic[employer]-no auth req'd   Patient is scheduled at [facility] for 11/13/22 at 9 pm.  Mailed packet to the patient.

## 2022-09-19 ENCOUNTER — Telehealth: Payer: Self-pay | Admitting: Neurology

## 2022-09-19 NOTE — Telephone Encounter (Signed)
Her sleep study appt is on 11/13/22.

## 2022-09-19 NOTE — Telephone Encounter (Signed)
I called the pt and LVM (Ok per [facility]) advising per Dr Frances Furbish that pt's MR angio head which looks at the major brain supply arteries was reported as normal. I advised the next step is a sleep study and I see it has been scheduled for 11/13/22. I provided the patient with our office number and asked her for a call back if she has any questions.

## 2022-09-19 NOTE — Telephone Encounter (Signed)
I received patient's brain MRA report.  She had an MR angiogram of the head without contrast through [facility] on 09/10/2022: Impression: Normal MRA of the brain.   Please advise patient that her MRA of the head which looks at the major brain supply arteries was reported as normal.    As planned, we will proceed with a sleep study.  Please ask her if she has been contacted yet to schedule her sleep study.

## 2022-11-13 ENCOUNTER — Ambulatory Visit (INDEPENDENT_AMBULATORY_CARE_PROVIDER_SITE_OTHER): Payer: Medicare Other | Admitting: Neurology

## 2022-11-13 DIAGNOSIS — R519 Headache, unspecified: Secondary | ICD-10-CM

## 2022-11-13 DIAGNOSIS — G4733 Obstructive sleep apnea (adult) (pediatric): Secondary | ICD-10-CM | POA: Diagnosis not present

## 2022-11-13 DIAGNOSIS — R0683 Snoring: Secondary | ICD-10-CM

## 2022-11-13 DIAGNOSIS — R351 Nocturia: Secondary | ICD-10-CM

## 2022-11-13 DIAGNOSIS — R9082 White matter disease, unspecified: Secondary | ICD-10-CM

## 2022-11-13 DIAGNOSIS — M0579 Rheumatoid arthritis with rheumatoid factor of multiple sites without organ or systems involvement: Secondary | ICD-10-CM

## 2022-11-13 DIAGNOSIS — H538 Other visual disturbances: Secondary | ICD-10-CM

## 2022-11-13 DIAGNOSIS — G4720 Circadian rhythm sleep disorder, unspecified type: Secondary | ICD-10-CM

## 2022-11-13 DIAGNOSIS — R9431 Abnormal electrocardiogram [ECG] [EKG]: Secondary | ICD-10-CM

## 2022-11-14 NOTE — Procedures (Signed)
Physician Interpretation:     [facility] POLYSOMNOGRAPHY  INTERPRETATION REPORT   STUDY DATE:  11/13/2022     PATIENT NAME:  Cindy [name]         DATE OF BIRTH:  14-Oct-1955  PATIENT ID:  [id number]    TYPE OF STUDY:  PSG  READING PHYSICIAN: Huston Foley, MD, PhD   SCORING TECHNICIAN: Domingo Cocking, RPSGT   Referred by: Dr. Adelene Amas ? History and Indication for Testing: 67 year old female with an underlying complex medical history of anemia, arthritis, including rheumatoid arthritis and osteoarthritis, on Remicade infusions, hypertension, hyperlipidemia, reflux disease, depression, anxiety, coronary artery disease with status post angioplasty, history of uterine cancer, low back pain with status post lumbar discectomy in 2021 and lumbar spine fusion in 2023 pneumonia, and overweight state, who reports recurrent headaches, snoring and nocturia.  Height: 66 in Weight: 174 lb (BMI 28).    MEDICATIONS: Ventolin HFA, Xanax, Aspirin, Vitamin D3, Cymbalta, Zetia, Folvite, Hydrodiuril, Remicade IV, Synthroid, Zestril, Mag-Ox, Rheumatrex, Multivitamin w/minerals, Nitrostat, Lyrica, eye drops, Crestor, Vitamin E, Voltaren   TECHNICAL DESCRIPTION: A registered sleep technologist was in attendance for the duration of the recording.  Data collection, scoring, video monitoring, and reporting were performed in compliance with the AASM Manual for the Scoring of Sleep and Associated Events; (Hypopnea is scored based on the criteria listed in Section VIII D. 1b in the AASM Manual V2.6 using a 4% oxygen desaturation rule or Hypopnea is scored based on the criteria listed in Section VIII D. 1a in the AASM Manual V2.6 using 3% oxygen desaturation and /or arousal rule).   SLEEP CONTINUITY AND SLEEP ARCHITECTURE:  Lights-out was at 21:12: and lights-on at  05:12:, with a total recording time of 8 hours. Total sleep time ( TST) was 466.5 minutes with a high sleep efficiency at  97.2%. There was  25.6% REM sleep.   BODY POSITION:  TST was divided  between the following sleep positions: 100.0% supine;  0.0% lateral;  0% prone. Duration of total sleep and percent of total sleep in their respective position is as follows: supine 466 minutes (100%), non-supine 0 minutes (0%); right 00 minutes (0%), left 00 minutes (0%), and prone 00 minutes (0%).  Total supine REM sleep time was 119 minutes (100% of total REM sleep).  Sleep latency was normal at 5.5 minutes.  REM sleep latency was slightly below normal at 66.0 minutes. Of the total sleep time, the percentage of stage N1 sleep was 6.1%, which is normal, stage N2 sleep was 68%, which is increased, stage N3 sleep was absent, and REM sleep was 25.6%, which is high normal. Wake after sleep onset (WASO) time accounted for 8 minutes with no significant sleep fragmentation noted.   RESPIRATORY MONITORING:   Based on CMS criteria (using a 4% oxygen desaturation rule for scoring hypopneas), there were 18 apneas (16 obstructive; 0 central; 2 mixed), and 65 hypopneas.  Apnea index was 2.3. Hypopnea index was 8.4. The apnea-hypopnea index was 10.7/hour overall (10.7 supine, 0 non-supine; 26.1/hour in REM sleep, 26.1 supine REM).  There were 0 respiratory effort-related arousals (RERAs).  The RERA index was 0 events/h. Total respiratory disturbance index (RDI) was 10.7 events/h. RDI results showed: supine RDI  10.7 /h; non-supine RDI 0.0 /h; REM RDI 26.1 /h, supine REM RDI 26.1 /h.   Based on AASM criteria (using a 3% oxygen desaturation and /or arousal rule for scoring hypopneas), there were 18 apneas (16 obstructive; 0 central; 2  mixed), and 78 hypopneas. Apnea index was 2.3. Hypopnea index was 10.0. The apnea-hypopnea index was 12.3 overall (12.3 supine, 0 non-supine; 28.6 REM, 28.6 supine REM).  There were 0 respiratory effort-related arousals (RERAs).  The RERA index was 0 events/h. Total respiratory disturbance index (RDI) was 12.3  events/h. RDI results showed: supine RDI  12.3 /h; non-supine RDI 0.0 /h; REM RDI 28.6 /h, supine REM RDI 28.6 /h.   OXIMETRY: Oxyhemoglobin Saturation Nadir during sleep was at  77% from a mean of 95%.  Of the Total sleep time (TST)   hypoxemia (=<88%) was present for  8.9 minutes, or 1.9% of total sleep time.   LIMB MOVEMENTS: There were 0 periodic limb movements of sleep (0.0/hr), of which 0 (0.0/hr) were associated with an arousal.   AROUSAL: There were 19 arousals in total, for an arousal index of 2 arousals/hour.  Of these, 9 were identified as respiratory-related arousals (1 /h), 0 were PLM-related arousals (0 /h), and 13 were non-specific arousals (2 /h).   EEG: Review of the EEG showed no abnormal electrical discharges and symmetrical bihemispheric findings.    EKG: The EKG revealed normal sinus rhythm (NSR) with frequent PVCs noted. The average heart rate during sleep was 68 bpm.   AUDIO/VIDEO REVIEW: The audio and video review did not show any abnormal or unusual behaviors, movements, phonations or vocalizations. The patient took no restroom breaks. Snoring was noted, in the mild to moderate range.  POST-STUDY QUESTIONNAIRE: Post study, the patient indicated, that sleep was better than usual.   IMPRESSION:  1. Obstructive Sleep Apnea (OSA) 2. Dysfunctions associated with sleep stages or arousal from sleep 3. Non-specific abnormal electrocardiogram (EKG)  RECOMMENDATIONS:  1. This study demonstrates overall mild obstructive sleep apnea, more pronounced during REM sleep with a total AHI of 10.7/hour, REM AHI of 26.1/hour, supine AHI of 10.7/hour and O2 nadir of 77% (during supine REM sleep). Given the patient's medical history and sleep related complaints, treatment with positive airway pressure is recommended; this can be achieved in the form of autoPAP. Alternatively, a full-night CPAP titration study would allow optimization of therapy if needed. Other treatment options may include  avoidance of supine sleep position along with weight loss (if clinically indicated), or the use of an oral appliance in selected patients. Please note, that untreated obstructive sleep apnea may carry additional perioperative morbidity. Patients with significant obstructive sleep apnea should receive perioperative PAP therapy and the surgeons and particularly the anesthesiologist should be informed of the diagnosis and the severity of the sleep disordered breathing. 2. The study showed frequent PVCs (premature ventricular contractions) on single lead EKG; clinical correlation is recommended. The patient iw well-known to cardiology.  3. This study shows no significant sleep fragmentation and mildly abnormal sleep stage percentages; these are nonspecific findings and per se do not signify an intrinsic sleep disorder or a cause for the patient's sleep-related symptoms. Causes include (but are not limited to) the first night effect of the sleep study, circadian rhythm disturbances, medication effect or an underlying mood disorder or medical problem.  4. The patient should be cautioned not to drive, work at heights, or operate dangerous or heavy equipment when tired or sleepy. Review and reiteration of good sleep hygiene measures should be pursued with any patient. 5. The patient will be seen in follow-up by Dr. Frances Furbish at [facility] for discussion of the test results and further management strategies. The referring provider will be notified of the test results.   I certify  that I have reviewed the entire raw data recording prior to the issuance of this report in accordance with the Standards of Accreditation of the American Academy of Sleep Medicine (AASM).  Huston Foley, MD, PhD Medical Director, [facility]) Diplomat, ABPN (Neurology and Sleep)               Technical Report:   General Information  Name: Cindy [name], Cindy [name] BMI: 28.51 Physician: Huston Foley, MD  ID:  [id number] Height: 65.5 in Technician: Domingo Cocking, RPSGT  Sex: Female Weight: 174.0 lb Record: [id number]  Age: 50 [Feb 14, 1956] Date: 11/13/2022    Medical & Medication History    Ms. Chimenti is a 67 year old female with an underlying complex medical history of anemia, arthritis, including rheumatoid arthritis and osteoarthritis, on Remicade infusions, hypertension, hyperlipidemia, reflux disease, depression, anxiety, coronary artery disease with status post angioplasty, history of uterine cancer, low back pain with status post lumbar discectomy in 2021 and lumbar spine fusion in 2023 pneumonia, and overweight state, who reports an approximately 71-month history of recurrent headaches affecting primarily her left side, the headache is not particularly severe, feels like a pressure or sometimes sharp, it feels like a soreness on the head, typically on the top left side of the head, sometimes radiating to the temple area and behind the eyes bilaterally. She has associated nausea but the nausea seems to be independent of the headaches. She has some light sensitivity but not all the time, no history of migraines, no family history of migraines, no family history of sleep apnea, no obvious trigger, sometimes it helps to massage the temple area although it increases the soreness initially but then relieves it. She has been taking nearly daily Advil or Excedrin, most recently daily Excedrin, usually 1 pill in the midday. She snores, her husband is on speaker phone and denies any apneas. She has sleep interruption from nocturia about twice per night and wakes up often with a headache in the mornings. She does sleep about 10 to 12 hours in any given day. Goes to bed around 8:30 PM or 9 PM and falls asleep by 10 PM. She sleeps till 10 AM often.  Ventolin HFA, Xanax, Aspirin, Vitamin D3, Cymbalta, Zetia, Folvite, Hydrodiuril, Remicade IV, Synthroid, Zestril, Mag-Ox, Rheumatrex, Multivitamin w/minerals, Nitrostat,  Lyrica, eye drops, Crestor, Vitamin E, Voltaren   Sleep Disorder      Comments   Patient arrived for a diagnostic polysomnogram. Procedure explained and all questions answered. Standard paste setup without complications. Patient slept supine. Occasional mild to moderate snoring was heard. Frequent respiratory events observed. Cardiac arrhythmias noted, frequent PVC's. PLMS observed. Nocturia x 0.    Lights out: 09:12:53 PM Lights on: 05:12:42 AM   Time Total Supine Side Prone Upright  Recording (TRT) 8h 0.30m 8h 0.83m 0h 0.28m 0h 0.43m 0h 0.64m  Sleep (TST) 7h 46.72m 7h 46.47m 0h 0.33m 0h 0.75m 0h 0.8m   Latency N1 N2 N3 REM Onset Per. Slp. Eff.  Actual 0h 0.37m 0h 1.74m 0h 0.51m 1h 6.87m 0h 5.25m 0h 10.70m 97.19%   Stg Dur Wake N1 N2 N3 REM  Total 13.5 28.5 318.5 0.0 119.5  Supine 13.5 28.5 318.5 0.0 119.5  Side 0.0 0.0 0.0 0.0 0.0  Prone 0.0 0.0 0.0 0.0 0.0  Upright 0.0 0.0 0.0 0.0 0.0   Stg % Wake N1 N2 N3 REM  Total 2.8 6.1 68.3 0.0 25.6  Supine 2.8 6.1 68.3 0.0 25.6  Side 0.0 0.0 0.0 0.0 0.0  Prone 0.0 0.0 0.0 0.0 0.0  Upright 0.0 0.0 0.0 0.0 0.0     Apnea Summary Sub Supine Side Prone Upright  Total 18 Total 18 18 0 0 0    REM 9 9 0 0 0    NREM 9 9 0 0 0  Obs 16 REM 9 9 0 0 0    NREM 7 7 0 0 0  Mix 2 REM 0 0 0 0 0    NREM 2 2 0 0 0  Cen 0 REM 0 0 0 0 0    NREM 0 0 0 0 0   Rera Summary Sub Supine Side Prone Upright  Total 0 Total 0 0 0 0 0    REM 0 0 0 0 0    NREM 0 0 0 0 0   Hypopnea Summary Sub Supine Side Prone Upright  Total 78 Total 78 78 0 0 0    REM 48 48 0 0 0    NREM 30 30 0 0 0   4% Hypopnea Summary Sub Supine Side Prone Upright  Total (4%) 65 Total 65 65 0 0 0    REM 43 43 0 0 0    NREM 22 22 0 0 0     AHI Total Obs Mix Cen  12.35 Apnea 2.32 2.06 0.26 0.00   Hypopnea 10.03 -- -- --  10.68 Hypopnea (4%) 8.36 -- -- --    Total Supine Side Prone Upright  Position AHI 12.35 12.35 0.00 0.00 0.00  REM AHI 28.62   NREM AHI 6.74   Position RDI 12.35 12.35 0.00  0.00 0.00  REM RDI 28.62   NREM RDI 6.74    4% Hypopnea Total Supine Side Prone Upright  Position AHI (4%) 10.68 10.68 0.00 0.00 0.00  REM AHI (4%) 26.11   NREM AHI (4%) 5.36   Position RDI (4%) 10.68 10.68 0.00 0.00 0.00  REM RDI (4%) 26.11   NREM RDI (4%) 5.36    Desaturation Information Threshold: 2% <100% <90% <80% <70% <60% <50% <40%  Supine 248.0 18.0 2.0 0.0 0.0 0.0 0.0  Side 0.0 0.0 0.0 0.0 0.0 0.0 0.0  Prone 0.0 0.0 0.0 0.0 0.0 0.0 0.0  Upright 0.0 0.0 0.0 0.0 0.0 0.0 0.0  Total 248.0 18.0 2.0 0.0 0.0 0.0 0.0  Index 31.4 2.3 0.3 0.0 0.0 0.0 0.0   Threshold: 3% <100% <90% <80% <70% <60% <50% <40%  Supine 143.0 18.0 2.0 0.0 0.0 0.0 0.0  Side 0.0 0.0 0.0 0.0 0.0 0.0 0.0  Prone 0.0 0.0 0.0 0.0 0.0 0.0 0.0  Upright 0.0 0.0 0.0 0.0 0.0 0.0 0.0  Total 143.0 18.0 2.0 0.0 0.0 0.0 0.0  Index 18.1 2.3 0.3 0.0 0.0 0.0 0.0   Threshold: 4% <100% <90% <80% <70% <60% <50% <40%  Supine 95.0 17.0 2.0 0.0 0.0 0.0 0.0  Side 0.0 0.0 0.0 0.0 0.0 0.0 0.0  Prone 0.0 0.0 0.0 0.0 0.0 0.0 0.0  Upright 0.0 0.0 0.0 0.0 0.0 0.0 0.0  Total 95.0 17.0 2.0 0.0 0.0 0.0 0.0  Index 12.0 2.1 0.3 0.0 0.0 0.0 0.0   Threshold: 3% <100% <90% <80% <70% <60% <50% <40%  Supine 143 18 2 0 0 0 0  Side 0 0 0 0 0 0 0  Prone 0 0 0 0 0 0 0  Upright 0 0 0 0 0 0 0  Total 143 18 2 0 0 0 0   Awakening/Arousal Information #  of Awakenings 14  Wake after sleep onset 8.12m  Wake after persistent sleep 7.62m   Arousal Assoc. Arousals Index  Apneas 6 0.8  Hypopneas 3 0.4  Leg Movements 7 0.9  Snore 0 0.0  PTT Arousals 0 0.0  Spontaneous 13 1.7  Total 29 3.7  Leg Movement Information PLMS LMs Index  Total LMs during PLMS 0 0.0  LMs w/ Microarousals 0 0.0   LM LMs Index  w/ Microarousal 7 0.9  w/ Awakening 5 0.6  w/ Resp Event 0 0.0  Spontaneous 8 1.0  Total 15 1.9     Desaturation threshold setting: 3% Minimum desaturation setting: 10 seconds SaO2 nadir: 77% The longest event was a 49 sec  obstructive Hypopnea with a minimum SaO2 of 82%. The lowest SaO2 was 77% associated with a 21 sec obstructive Hypopnea. EKG Rates EKG Avg Max Min  Awake 74 86 61  Asleep 68 89 58  EKG Events: N/A

## 2022-11-14 NOTE — Addendum Note (Signed)
Addended by: Huston Foley on: 11/14/2022 05:45 PM   Modules accepted: Orders

## 2022-11-21 ENCOUNTER — Telehealth: Payer: Self-pay | Admitting: Neurology

## 2022-11-21 NOTE — Telephone Encounter (Signed)
Contacted pt, LVM rq call back  

## 2022-11-21 NOTE — Telephone Encounter (Signed)
Pt states she missed a call and believes it was regarding the results of her sleep study, please call pt back.

## 2022-11-22 NOTE — Telephone Encounter (Addendum)
Pt returned call, I went over the [facility] message I sent her. Pt agreed to moving forward, I have sent orders to [facility] and received confirmation. FU appt was also made with SS 02/07/23   Ambri Miltner, Abbe Amsterdam, CMA  Zott, Ermalinda Memos, Tammy New orders have been placed for the above pt, DOB: Dec 28, 2055 Thanks   Zott, Stacy  Rooney Gladwin, Abbe Amsterdam, CMA; Melvern Sample Got It

## 2022-11-25 ENCOUNTER — Encounter: Payer: Self-pay | Admitting: Neurology

## 2022-11-26 NOTE — Telephone Encounter (Signed)
I looked over the testing Dr. Frances Furbish ordered at their last visit and fortunately her MRI/MRA was normal and blood work did not show signs of giant cell arteritis. She should follow up with Dr. Frances Furbish for symptom management, but I don't think she needs any urgent workup before that appointment unless her PCP finds something concerning at their visit

## 2022-11-26 NOTE — Telephone Encounter (Signed)
Spoke to patient gave Dr Delena Bali recommendation. Pt is very persistent that something Neurological is going  on with her Pt states will go to PCP Wednesday . I did make pt aware waiting on a appointment on hold for  next week to see if pt is coming Pt expressed understanding and thanked me for calling

## 2022-11-26 NOTE — Telephone Encounter (Signed)
Pt states had 2 episodes of headache pain and stroke like symptoms. went to ED July 24 and had 2 Cat scans done (negative for stroke)   Had another Episode which  was the same but more intense  These episodes include:Headache that starts on upper left side and moves to temple and over left eye causing pain and  potential  inflammation   Moves along front of ear causing ear pain than  jaw and across left side of face.  Face felt numb and swollen. Pt states had another episode Aug 3rd didn't go to ED. Pt states going to PCP this week Pt stating left side temporal pain with pulsating. Pt states feels like left side of face is swollen. Advised pt to keep appointment with PCP and if symptoms worsen please go to ED  Made pt aware will try to  get her an appointment next week with Dr Frances Furbish  however please don't wait and go to PCP Wednesday. Pt expressed understanding and thanked me for calling

## 2022-11-27 NOTE — Telephone Encounter (Signed)
Spoke with patient and scheduled her with Dr Frances Furbish next Thurs 8/15 at 8:45 AM arrival 8:15 am. Patient appreciative. She states she is not better but has gotten worse. States she doesn't have balance on the right side. She sees primary care tomorrow. I have stressed to her that if she's worsening we recommend she go back to ER for other workup. Pt verbalized understanding.

## 2022-12-05 ENCOUNTER — Telehealth: Payer: Self-pay | Admitting: Neurology

## 2022-12-05 NOTE — Telephone Encounter (Signed)
LVM and sent mychart msg informing pt of appt change due to NP being out 

## 2022-12-06 ENCOUNTER — Ambulatory Visit: Payer: Medicare Other | Admitting: Neurology

## 2022-12-06 ENCOUNTER — Encounter: Payer: Self-pay | Admitting: Neurology

## 2022-12-06 VITALS — BP 112/75 | HR 66 | Ht 65.0 in | Wt 169.0 lb

## 2022-12-06 DIAGNOSIS — R519 Headache, unspecified: Secondary | ICD-10-CM

## 2022-12-06 DIAGNOSIS — M542 Cervicalgia: Secondary | ICD-10-CM | POA: Diagnosis not present

## 2022-12-06 DIAGNOSIS — R2689 Other abnormalities of gait and mobility: Secondary | ICD-10-CM | POA: Diagnosis not present

## 2022-12-06 DIAGNOSIS — R202 Paresthesia of skin: Secondary | ICD-10-CM | POA: Diagnosis not present

## 2022-12-06 DIAGNOSIS — G4733 Obstructive sleep apnea (adult) (pediatric): Secondary | ICD-10-CM

## 2022-12-06 NOTE — Progress Notes (Signed)
Subjective:    Patient ID: Cindy [name] is a 67 y.o. female.  HPI    Interim history:   Cindy [name] is a 67 year old female with an underlying complex medical history of anemia, arthritis, including rheumatoid arthritis and osteoarthritis, on Remicade infusions, hypertension, hyperlipidemia, reflux disease, depression, anxiety, coronary artery disease with status post angioplasty, history of uterine cancer, low back pain with status post lumbar discectomy in 2021 and lumbar spine fusion in 2023 pneumonia, and overweight state, who presents for follow-up consultation of her recurrent headaches.  The patient is accompanied by her husband today.  I first met her at the request of her primary care physician on 08/14/2022, at which time she reported an approximately 42-month history of recurrent mostly left-sided headaches with some radiation to the left temple area and behind both eyes.  She reported no prior history of migraines.  She was on Lyrica and Cymbalta at the time and we mutually agreed not to add any other symptomatic medication.  She had seen cardiology in the past and also ophthalmology.  She is followed by rheumatology.  She was advised to proceed with further evaluation for her new onset headaches with laboratory testing and imaging testing.  Laboratory testing showed a normal ESR of 8, CRP normal at 2, prior laboratory testing through [facility] showed a normal TSH at 1.69 on 06/19/2022.  A1c was 6.0 on 08/15/2022.    She had an MR angiogram of the head without contrast through [facility] on 09/10/2022 and I reviewed the results: Impression: Normal MRA of the brain.  She had a baseline sleep study through our sleep lab on 11/13/2022 which showed overall mild obstructive sleep apnea with a total AHI of 10.7/h, with moderate events during REM sleep with a REM AHI of 26.1/h, O2 nadir 77% during supine REM sleep.  She was advised to proceed with home AutoPAP therapy.  Today, 12/06/2022: She  reports that she had a recent episode of headache with radiation of the pain to the left eye.  She has seen her optometrist, prescription has stayed the same.  Yesterday she had that pain, it radiated upwards on the right side in the back.  She had an x-ray through her neurosurgical office of the neck.  She has not had an MRI of the neck.  She reports some numbness in the left face.  She has seen her dentist about 6 months ago but not recently.  She feels that her neck pain radiates to the right eye into the ear at times.  She does not believe she has seen her ophthalmologist in some time.  She tries to hydrate well but does complain of dry mouth.  Her primary care started her on a course of prednisone and she is going to finish it this week.  Her primary care also ordered an ultrasound of the temporal artery.  She reports intermittent balance problems, she has not had an actual fall recently.  She does not use a walking aid.  She holds onto things or onto her husband when she is with him.  She is supposed to pick up her AutoPap machine today.  Of note, she presented to the emergency room at [facility] on 11/16/2022 with a headache.  She also had numbness and tingling, felt off balance.  I reviewed the emergency room records. She had a CT angiogram of the head and neck through [facility] on 11/16/2022 and I reviewed the results: Impression: 1.  No  acute intracranial abnormality. However, CT is relatively insensitive for the detection of acute infarct within the first 24-48 hours, and MRI may be indicated if there is high clinical suspicion.  2.  No acute arterial abnormality in the head or neck.  3.  6 mm left upper lobe pulmonary nodule. Recommend follow-up chest CT in 6-12 months.    The patient's allergies, current medications, family history, past medical history, past social history, past surgical history and problem list were reviewed and updated as appropriate.    Previously:   08/14/22: (She) reports an approximately 2-month history of recurrent headaches affecting primarily her left side, the headache is not particularly severe, feels like a pressure or sometimes sharp, it feels like a soreness on the head, typically on the top left side of the head, sometimes radiating to the temple area and behind the eyes bilaterally.  She has associated nausea but the nausea seems to be independent of the headaches.  She has some light sensitivity but not all the time, no history of migraines, no family history of migraines, no family history of sleep apnea, no obvious trigger, sometimes it helps to massage the temple area although it increases the soreness initially but then relieves it.  She has been taking nearly daily Advil or Excedrin, most recently daily Excedrin, usually 1 pill in the midday.  She snores, her husband is on speaker phone and denies any apneas.  She has sleep interruption from nocturia about twice per night and wakes up often with a headache in the mornings.  She has not had any sudden onset of one-sided weakness or numbness or tingling or droopy face or slurring of speech. She does not drink daily caffeine, she does not drink alcohol regularly, she is a non-smoker. She does sleep about 10 to 12 hours in any given day.  Goes to bed around 8:30 PM or 9 PM and falls asleep by 10 PM.  She sleeps till 10 AM often. She has noticed about a month or 2 ago swelling of the left foot.  She has had a recent change in her Lyrica dose.  The swelling is painful in the left foot and has subsided some but it was significantly swollen initially.  No injury to the left ankle.   She is followed regularly by cardiology.  She was told by her ophthalmologist that she did not have any vision changes and no new glasses were provided.  She does have a history of cataracts which they are watching.   I reviewed your office note from 06/19/2022.  She reported a left-sided headache  for the past month. She reported a remote history of shingles on the left side of the scalp.  She had elevated blood pressure values.  She had trouble focusing and problems with her eyes.  She saw an eye doctor in December 2023.  Of note, she is on multiple medications including duloxetine 60 mg daily, and Lyrica 75 mg strength in the morning and 150 mg at bedtime, also Xanax for anxiety or sleep, 0.5 mg strength 1 pill twice daily as needed.  Of note, she is also on methotrexate and gets Remicade infusions.  Full list of medications below.  She had blood work through your office on 06/19/2022 and I reviewed the results.  She had a CBC with differential, CMP, TSH, magnesium, results were benign with the exception of mildly elevated BUN at 25, blood sugar was 111, creatinine 1.06.  RBC was low normal at 3.98, hemoglobin  slightly below normal at 12.1. In 2020 her CRP was low, no recent CRP. Anti CCP low in 2021.   She had a brain MRI w/wo contrast through [facility] on 08/01/22 and I reviewed the results: IMPRESSION:   1. No acute intracranial abnormality.  2.  Few scattered periventricular and subcortical T2/FLAIR hyperintense white matter lesions are abnormal but nonspecific, typically attributed to prior trauma/inflammation/demyelination, or chronic ischemia associated with migraine/atherosclerosis.     Her Past Medical History Is Significant For: Past Medical History:  Diagnosis Date   Anemia    Anginal pain (HCC)    Anxiety    Arthritis    CAD S/P percutaneous coronary angioplasty 2006   s/p BMS PCI in 2006 - Dx (3.0x90mm Driver BMS);  CATH 9/62: mDx ~ 20% ISR, EF 65%; NORMAL STUDY.  EF 60-65%.  No stress-induced arrhythmias or wall motion abnormalities.  Normal EKG.   Depression    Dyslipidemia    Dysrhythmia    tachycardia if she misses Toprol   GERD (gastroesophageal reflux disease)    Hyperlipidemia    Hypertension    Hypothyroidism    Osteoarthritis    Shoulder and hip    Pneumonia    Uterine cancer [facility])     Her Past Surgical History Is Significant For: Past Surgical History:  Procedure Laterality Date   ABDOMINAL HYSTERECTOMY     atrial valve surgery     05/2022   BACK SURGERY     [facility], and 2021 (S1, and slipped disc)   COLONOSCOPY     CORONARY STENT INTERVENTION  2006   BMS PCI of Diag: 3.0x82mm Driver BMS   LEFT HEART CATH AND CORONARY ANGIOGRAPHY  2006   -> Resulted in PCI of Diag   LEFT HEART CATH AND CORONARY ANGIOGRAPHY  11/2010    mDx stent ok with 20% ISR, EF 65%;    POSTERIOR LUMBAR FUSION  02/23/2015   L4   L5   STRESS ECHOCARDIOGRAM  07/2017   NORMAL STUDY.  EF 60-65%.  No stress-induced arrhythmias or wall motion abnormalities.  Normal EKG.   TONSILLECTOMY AND ADENOIDECTOMY     TOTAL ABDOMINAL HYSTERECTOMY W/ BILATERAL SALPINGOOPHORECTOMY  2008   UPPER GASTROINTESTINAL ENDOSCOPY      Her Family History Is Significant For: Family History  Problem Relation Age of Onset   Heart attack Mother 64   Diabetes Mother    Rheum arthritis Mother    Glaucoma Sister    Cancer Sister    Lymphoma Sister    Melanoma Sister    Heart disease Maternal Grandmother    Colon cancer Neg Hx     Her Social History Is Significant For: Social History   Socioeconomic History   Marital status: Married    Spouse name: Not on file   Number of children: 2   Years of education: Not on file   Highest education level: Not on file  Occupational History   Occupation: Optician, dispensing: [employer]  Tobacco Use   Smoking status: Never   Smokeless tobacco: Never   Tobacco comments:    Tobacco use-no  Vaping Use   Vaping status: Never Used  Substance and Sexual Activity   Alcohol use: Yes    Comment: Social   Drug use: No   Sexual activity: Not on file  Other Topics Concern   Not on file  Social History Narrative    Moved to [place] from [place].    Caffiene occasionally.  Social Determinants of Health   Financial Resource  Strain: Low Risk  (10/27/2021)   Received from [facility], [facility] visits prior to 06/23/2022., [facility], [facility] visits prior to 06/23/2022.   Overall Financial Resource Strain (CARDIA)    Difficulty of Paying Living Expenses: Not very hard  Food Insecurity: Low Risk  (10/01/2022)   [facility]   Food vital sign    Within the past 12 months, you worried that your food would run out before you got money to buy more: Never true    Within the past 12 months, the food you bought just didn't last and you didn't have money to get more. : Never true  Transportation Needs: Not on file (10/01/2022)  Physical Activity: Sufficiently Active (10/27/2021)   [facility] from [facility], [facility] visits prior to 06/23/2022., [facility], [facility] visits prior to 06/23/2022.   Exercise Vital Sign    Days of Exercise per Week: 4 days    Minutes of Exercise per Session: 40 min  Stress: No Stress Concern Present (10/27/2021)   [facility] from [facility], [facility] prior to 06/23/2022., [facility], [facility] visits prior to 06/23/2022.   [employer] of Occupational Health - Occupational Stress Questionnaire    Feeling of Stress : Only a little  Social Connections: Unknown (08/08/2022)   Received from [facility], [facility]   Social Network    Social Network: Not on file    Her Allergies Are:  Allergies  Allergen Reactions   Amlodipine Swelling    Location of swelling unknown   Morphine And Codeine Itching  :   Her Current Medications Are:  Outpatient Encounter Medications as of 12/06/2022  Medication Sig   albuterol (VENTOLIN HFA) 108 (90 Base) MCG/ACT inhaler Inhale 2 puffs into the lungs every 6 (six) hours as needed for wheezing or shortness of breath.   ALPRAZolam (XANAX) 0.5 MG tablet Take 0.5 tablets by  mouth as needed. As needed for anxiety   aspirin EC 81 MG tablet Take 81 mg by mouth daily. Swallow whole.   Cholecalciferol (D3-1000) 25 MCG (1000 UT) capsule Take 1,000 Units by mouth daily.   diclofenac Sodium (VOLTAREN) 1 % GEL Apply 2 g topically 2 (two) times daily.   DULoxetine (CYMBALTA) 60 MG capsule Take 60 mg by mouth daily.   ezetimibe (ZETIA) 10 MG tablet Take 1 tablet (10 mg total) by mouth daily.   folic acid (FOLVITE) 1 MG tablet Take 1 mg by mouth daily.   hydrochlorothiazide (HYDRODIURIL) 25 MG tablet Take 25 mg by mouth daily.   inFLIXimab (REMICADE IV) Inject into the vein. Every 7 weeks   levothyroxine (SYNTHROID) 88 MCG tablet Take 88 mcg by mouth daily before breakfast.   lisinopril (ZESTRIL) 40 MG tablet Take 40 mg by mouth daily.   magnesium oxide (MAG-OX) 400 (240 Mg) MG tablet Take 400 mg by mouth daily.   methotrexate (RHEUMATREX) 2.5 MG tablet Take 2.5 mg by mouth once a week. Caution:Chemotherapy. Protect from light.   Multiple Vitamins-Minerals (PRESERVISION AREDS PO) Take 1 tablet by mouth daily.   nitroGLYCERIN (NITROSTAT) 0.4 MG SL tablet Place 0.4 mg under the tongue every 5 (five) minutes as needed for chest pain.   pantoprazole (PROTONIX) 40 MG tablet Take by mouth.   pregabalin (LYRICA) 75 MG capsule Take by mouth. Take 1  cap in am and 2 caps in pm.   Propylene Glycol (SYSTANE BALANCE) 0.6 % SOLN Apply 2 drops to eye daily.   rosuvastatin (CRESTOR) 40 MG tablet Take 40 mg by mouth daily.   VITAMIN E PO Take 1 capsule by mouth daily.   No facility-administered encounter medications on file as of 12/06/2022.  :  Review of Systems:  Out of a complete 14 point review of systems, all are reviewed and negative with the exception of these symptoms as listed below:  Review of Systems  Neurological:        Rm 5 with spouse Cindy [name]  Pt is well, reports her headaches have worsen. She was having stroke like symptoms and imbalance. Currently having some facial  numbness as well.      Objective:  Neurological Exam  Physical Exam Physical Examination:   Vitals:   12/06/22 0820  BP: 112/75  Pulse: 66    General Examination: The patient is a very pleasant 67 y.o. female in no acute distress. She appears well-developed and well-nourished and well groomed.   HEENT: Normocephalic, atraumatic, pupils are equal, round and reactive to light, extraocular tracking is good without limitation to gaze excursion or nystagmus noted. Hearing is grossly intact. Face is symmetric with normal facial animation.  No photophobia noted.  Speech is clear with no dysarthria noted. There is no hypophonia. There is no lip, neck/head, jaw or voice tremor. Neck is supple with full range of passive and active motion. There are no carotid bruits on auscultation. Oropharynx exam reveals: Moderate mouth dryness, adequate dental hygiene and moderate airway crowding, due to small airway.  Tonsils absent, uvula on the small side, Mallampati class III. Tongue protrudes centrally and palate elevates symmetrically.     Chest: Clear to auscultation without wheezing, rhonchi or crackles noted.   Heart: S1+S2+0, regular and normal without murmurs, rubs or gallops noted.    Abdomen: Soft, non-tender and non-distended.   Extremities: There is trace to 1+ edema in the left foot, no significant swelling on the right.    Skin: Warm and dry without trophic changes noted.    Musculoskeletal: exam reveals arthritic changes in both hands.    Neurologically:  Mental status: The patient is awake, alert and oriented in all 4 spheres. Her immediate and remote memory, attention, language skills and fund of knowledge are appropriate. There is no evidence of aphasia, agnosia, apraxia or anomia. Speech is clear with normal prosody and enunciation. Thought process is linear. Mood is normal and affect is normal.  Cranial nerves II - XII are as described above under HEENT exam.  Motor exam: Normal  bulk, strength and tone is noted. There is no obvious action or resting tremor.  No postural tremor, no drift or rebound. Fine motor skills and coordination: intact grossly in the upper and lower extremities.   Cerebellar testing: No dysmetria or intention tremor. There is no truncal or gait ataxia.  Normal finger-to-nose, normal heel-to-shin with the right leg, difficulty with the left leg due to decreased range of motion, not new and stable from last time.   Sensory exam: intact to light touch in the upper and lower extremities.  Gait, station and balance: She stands easily. No veering to one side is noted. No leaning to one side is noted. Posture is age-appropriate and stance is narrow based. Gait shows normal stride length and normal pace. No problems turning are noted.  Romberg is negative. Reflexes are 1+ in the upper extremities and  trace in the lower extremities, toes are downgoing bilaterally.  She reports that she cannot walk in a straight line so we deferred tandem walk.     Assessment and Plan:  In summary, Cindy [name] is a 67 year old female with an underlying complex medical history of anemia, arthritis, including rheumatoid arthritis and osteoarthritis, on Remicade infusions, hypertension, hyperlipidemia, reflux disease, depression, anxiety, coronary artery disease with status post angioplasty, history of uterine cancer, low back pain with status post lumbar discectomy in 2021 and lumbar spine fusion in 2023 pneumonia, and overweight state, who presents for follow-up consultation of her recurrent headaches of approximately 10 months duration.  She continues to have a nonfocal neurological exam.  Differential diagnosis is vast.  She may have migraine headaches but symptoms are not telltale, she has some lingering facial paresthesias.  She has had visual symptoms off and on, not necessarily linked with headaches.  She has recent onset of neck pain.  She feels off balance at times.  She has  multiple neurological complaints, exam is benign today.  She went to the emergency room at [facility] in July with additional imaging at the time, she had a previous brain MRI in April 2024 which was benign and we did a MR angiogram in May 2024.  She had a sleep study in July 2024 which showed mild obstructive sleep apnea and she is going to pick up her AutoPap machine today.  She is reminded that treating sleep apnea may help her headaches.  She is advised to continue with her prednisone and finish the course as per PCP.  She is supposed to have a temporal artery ultrasound she indicates.  She is advised to talk to her primary care about potentially increasing her Lyrica for her facial paresthesias and neck pain but also encouraged to make an appointment with her neurosurgeon for a evaluation of her neck pain and a possible MRI of the neck.  She is advised to make an appointment with her ophthalmologist and dentist as well.  We will plan a follow-up after she has started using her AutoPap machine, she is already scheduled and October for a clinic visit with the nurse practitioner here.  She is advised to stay well-hydrated and rested and consider using a cane for gait stability.  I answered all their questions today and the patient and her husband were in agreement with the plan.   I spent 45 minutes in total face-to-face time and in reviewing records during pre-charting, more than 50% of which was spent in counseling and coordination of care, reviewing test results, reviewing medications and treatment regimen and/or in discussing or reviewing the diagnosis of recurrent headaches, the prognosis and treatment options. Pertinent laboratory and imaging test results that were available during this visit with the patient were reviewed by me and considered in my medical decision making (see chart for details).

## 2022-12-06 NOTE — Patient Instructions (Addendum)
It was nice to see you again.  I do hope you feel better after the steroid course. You can talk to Dr. Vivi Ferns for about going up on the Lyrica for recurrent headaches.  You are also taking Cymbalta.  Please make sure that your dentist evaluates you for your facial pain as well in case there is a problem with a root canal.  Please make an appointment with your ophthalmologist.  Please make an appointment with your neurosurgeon for your neck pain as you may benefit from getting an MRI. Please pick up your AutoPap machine today and start using your AutoPap machine regularly.  Keep your follow-up appointment in this clinic with Cindy Ege, NP for sleep apnea follow-up, I am hopeful that your sleep apnea treatment may aid in reducing your headaches.

## 2022-12-12 NOTE — Telephone Encounter (Signed)
Pt has appt in Oct with SS

## 2022-12-18 ENCOUNTER — Other Ambulatory Visit (HOSPITAL_COMMUNITY): Payer: Self-pay | Admitting: Family Medicine

## 2022-12-18 DIAGNOSIS — R519 Headache, unspecified: Secondary | ICD-10-CM

## 2023-01-01 ENCOUNTER — Ambulatory Visit (HOSPITAL_COMMUNITY)
Admission: RE | Admit: 2023-01-01 | Discharge: 2023-01-01 | Disposition: A | Discharge status: Home / Self Care | Payer: Medicare Other | Source: Ambulatory Visit | Attending: Family Medicine | Admitting: Family Medicine

## 2023-01-01 DIAGNOSIS — R519 Headache, unspecified: Secondary | ICD-10-CM | POA: Diagnosis present

## 2023-01-30 ENCOUNTER — Encounter: Payer: Self-pay | Admitting: Neurology

## 2023-02-04 ENCOUNTER — Telehealth: Payer: Self-pay

## 2023-02-04 ENCOUNTER — Ambulatory Visit: Payer: Self-pay | Admitting: Cardiology

## 2023-02-04 ENCOUNTER — Ambulatory Visit: Payer: Medicare Other | Admitting: Neurology

## 2023-02-04 VITALS — BP 147/81 | HR 67 | Ht 65.0 in | Wt 175.2 lb

## 2023-02-04 DIAGNOSIS — G4733 Obstructive sleep apnea (adult) (pediatric): Secondary | ICD-10-CM | POA: Diagnosis not present

## 2023-02-04 DIAGNOSIS — R519 Headache, unspecified: Secondary | ICD-10-CM | POA: Insufficient documentation

## 2023-02-04 NOTE — Telephone Encounter (Signed)
Orders sent to [facility], not [facility]

## 2023-02-04 NOTE — Telephone Encounter (Signed)
Community message sent to [facility] via epic

## 2023-02-04 NOTE — Progress Notes (Signed)
Patient: Cindy [name] Date of Birth: 1955/05/09  Reason for Visit: Follow up History from: Patient, husband Primary Neurologist: Cindy [name]  ASSESSMENT AND PLAN 67 y.o. year old female   1.  OSA on CPAP 2.  History of headaches, left-sided temporal 3.  Rheumatoid arthritis on methotrexate and Remicade  -Encouraged to continue nightly CPAP usage for minimum of 4 hours.  We will continue current settings, send an order to the DME for continued supplies.  She has not seen any change in her headaches with CPAP usage. -I did review with Dr. Frances [name], would recommend she follow-up with rheumatology for further consultation and workup for temporal arteritis.  We did discuss that we would repeat her ESR, CRP today.  She did have a temporal artery ultrasound September 2024 that was normal. -Continue follow-up with neurosurgery regarding neck pain, is to be considered for cervical ESI -We will follow up in 1 year for CPAP revisit   HISTORY OF PRESENT ILLNESS: Today 02/04/23 Here for initial CPAP visit. She had a baseline sleep study through our sleep lab on 11/13/2022 which showed overall mild obstructive sleep apnea with a total AHI of 10.7/h, with moderate events during REM sleep with a REM AHI of 26.1/h, O2 nadir 77% during supine REM sleep.  She was advised to proceed with home AutoPAP therapy.   Doing well with CPAP, sometimes with allergies, hard time using CPAP mask. Nasal mask, medium size. Some fatigue for other reasons like RA. Her cat wakes her up early morning. ESS 12.  CPAP data attached shows setup date 12/06/22, 5-11 cm. AHI 0.8, leak 0.6. 80% compliance greater than 4 hours nightly.   Mentions, seeing neurologist at [facility], undergoing work up for temporal arteritis. Is being treated for RA with methotrexate and Remicade. So far CRP and ESR were normal.   The CPAP did help with energy level and alertness during the day, no change to headaches. Celebrex is helping headaches, some thought  coming from neck? Pain can radiate into left jaw. No longer tingling or numb. Paw is gnawing, like tooth ache. Pain is present all the time,   Pain to left temple, to left temple, sensitive to touch, sometimes sees double vision to her left eye with reading. Did well on prednisone taper in August, it resolved, but returned once finished prednisone.   Temporal Artery Report 01/01/23 Summary:  Absence of a "halo" sign in the bilateral temporal artery, although not  definitive, makes a diagnosis of temporal arteritis unlikely.   MRI of cervical spine is below. Has seen neurosurgery. Planning to have consult about cervical ESI this week. All of this started in July. Headaches in feb. Stress worsens symptoms. Due for Remicade this Wednesday.  She is on Lyrica and Cymbalta.  1.  Moderate spinal canal stenosis at C6-C7 with additional levels of mild spinal canal stenosis as described above. Findings are similar to slightly progressed in comparison to the prior MRI from 09/12/2021.  2.  Facet and uncovertebral joint spurring contribute to varying degrees of multilevel neural foraminal stenosis as above.  3.  No cervical cord signal abnormality.   HISTORY  Cindy [name] is a 67 year old female with an underlying complex medical history of anemia, arthritis, including rheumatoid arthritis and osteoarthritis, on Remicade infusions, hypertension, hyperlipidemia, reflux disease, depression, anxiety, coronary artery disease with status post angioplasty, history of uterine cancer, low back pain with status post lumbar discectomy in 2021 and lumbar spine fusion in 2023 pneumonia, and overweight state, who presents for  follow-up consultation of her recurrent headaches.  The patient is accompanied by her husband today.  I first met her at the request of her primary care physician on 08/14/2022, at which time she reported an approximately 26-month history of recurrent mostly left-sided headaches with some radiation to the left  temple area and behind both eyes.  She reported no prior history of migraines.  She was on Lyrica and Cymbalta at the time and we mutually agreed not to add any other symptomatic medication.  She had seen cardiology in the past and also ophthalmology.  She is followed by rheumatology.  She was advised to proceed with further evaluation for her new onset headaches with laboratory testing and imaging testing.  Laboratory testing showed a normal ESR of 8, CRP normal at 2, prior laboratory testing through [facility] showed a normal TSH at 1.69 on 06/19/2022.  A1c was 6.0 on 08/15/2022.     She had an MR angiogram of the head without contrast through [facility] on 09/10/2022 and I reviewed the results: Impression: Normal MRA of the brain.  She had a baseline sleep study through our sleep lab on 11/13/2022 which showed overall mild obstructive sleep apnea with a total AHI of 10.7/h, with moderate events during REM sleep with a REM AHI of 26.1/h, O2 nadir 77% during supine REM sleep.  She was advised to proceed with home AutoPAP therapy.   Today, 12/06/2022: She reports that she had a recent episode of headache with radiation of the pain to the left eye.  She has seen her optometrist, prescription has stayed the same.  Yesterday she had that pain, it radiated upwards on the right side in the back.  She had an x-ray through her neurosurgical office of the neck.  She has not had an MRI of the neck.  She reports some numbness in the left face.  She has seen her dentist about 6 months ago but not recently.  She feels that her neck pain radiates to the right eye into the ear at times.  She does not believe she has seen her ophthalmologist in some time.  She tries to hydrate well but does complain of dry mouth.  Her primary care started her on a course of prednisone and she is going to finish it this week.  Her primary care also ordered an ultrasound of the temporal artery.  She reports intermittent balance problems, she  has not had an actual fall recently.  She does not use a walking aid.  She holds onto things or onto her husband when she is with him.  She is supposed to pick up her AutoPap machine today.   Of note, she presented to the emergency room at [facility] on 11/16/2022 with a headache.  She also had numbness and tingling, felt off balance.  I reviewed the emergency room records. She had a CT angiogram of the head and neck through [facility] on 11/16/2022 and I reviewed the results: Impression: 1.  No acute intracranial abnormality. However, CT is relatively insensitive for the detection of acute infarct within the first 24-48 hours, and MRI may be indicated if there is high clinical suspicion.  2.  No acute arterial abnormality in the head or neck.  3.  6 mm left upper lobe pulmonary nodule. Recommend follow-up chest CT in 6-12 months.   REVIEW OF SYSTEMS: Out of a complete 14 system review of symptoms, the patient complains only of the following symptoms,  and all other reviewed systems are negative.  See HPI  ALLERGIES: Allergies  Allergen Reactions   Amlodipine Swelling    Location of swelling unknown   Morphine And Codeine Itching    HOME MEDICATIONS: Outpatient Medications Prior to Visit  Medication Sig Dispense Refill   albuterol (VENTOLIN HFA) 108 (90 Base) MCG/ACT inhaler Inhale 2 puffs into the lungs every 6 (six) hours as needed for wheezing or shortness of breath.     ALPRAZolam (XANAX) 0.5 MG tablet Take 0.5 tablets by mouth as needed. As needed for anxiety     aspirin EC 81 MG tablet Take 81 mg by mouth daily. Swallow whole.     Cholecalciferol (D3-1000) 25 MCG (1000 UT) capsule Take 1,000 Units by mouth daily.     diclofenac Sodium (VOLTAREN) 1 % GEL Apply 2 g topically 2 (two) times daily.     DULoxetine (CYMBALTA) 60 MG capsule Take 60 mg by mouth daily.     ezetimibe (ZETIA) 10 MG tablet Take 1 tablet (10 mg total) by mouth daily. 90 tablet 3   folic  acid (FOLVITE) 1 MG tablet Take 1 mg by mouth daily.     hydrochlorothiazide (HYDRODIURIL) 25 MG tablet Take 25 mg by mouth daily.     inFLIXimab (REMICADE IV) Inject into the vein. Every 7 weeks     levothyroxine (SYNTHROID) 88 MCG tablet Take 88 mcg by mouth daily before breakfast.     lisinopril (ZESTRIL) 40 MG tablet Take 40 mg by mouth daily.     magnesium oxide (MAG-OX) 400 (240 Mg) MG tablet Take 400 mg by mouth daily.     methotrexate (RHEUMATREX) 2.5 MG tablet Take 2.5 mg by mouth once a week. Caution:Chemotherapy. Protect from light.     Multiple Vitamins-Minerals (PRESERVISION AREDS PO) Take 1 tablet by mouth daily.     nitroGLYCERIN (NITROSTAT) 0.4 MG SL tablet Place 0.4 mg under the tongue every 5 (five) minutes as needed for chest pain.     pantoprazole (PROTONIX) 40 MG tablet Take by mouth.     pregabalin (LYRICA) 75 MG capsule Take by mouth. Take 1 cap in am and 2 caps in pm.     Propylene Glycol (SYSTANE BALANCE) 0.6 % SOLN Apply 2 drops to eye daily.     rosuvastatin (CRESTOR) 40 MG tablet Take 40 mg by mouth daily.     VITAMIN E PO Take 1 capsule by mouth daily.     No facility-administered medications prior to visit.    PAST MEDICAL HISTORY: Past Medical History:  Diagnosis Date   Anemia    Anginal pain (HCC)    Anxiety    Arthritis    CAD S/P percutaneous coronary angioplasty 2006   s/p BMS PCI in 2006 - Dx (3.0x67mm Driver BMS);  CATH 9/62: mDx ~ 20% ISR, EF 65%; NORMAL STUDY.  EF 60-65%.  No stress-induced arrhythmias or wall motion abnormalities.  Normal EKG.   Depression    Dyslipidemia    Dysrhythmia    tachycardia if she misses Toprol   GERD (gastroesophageal reflux disease)    Hyperlipidemia    Hypertension    Hypothyroidism    Osteoarthritis    Shoulder and hip   Pneumonia    Uterine cancer (HCC)     PAST SURGICAL HISTORY: Past Surgical History:  Procedure Laterality Date   ABDOMINAL HYSTERECTOMY     atrial valve surgery     05/2022   BACK  SURGERY     [facility], and 2021 (S1, and slipped disc)   COLONOSCOPY     CORONARY STENT INTERVENTION  2006   BMS PCI of Diag: 3.0x51mm Driver BMS   LEFT HEART CATH AND CORONARY ANGIOGRAPHY  2006   -> Resulted in PCI of Diag   LEFT HEART CATH AND CORONARY ANGIOGRAPHY  11/2010    mDx stent ok with 20% ISR, EF 65%;    POSTERIOR LUMBAR FUSION  02/23/2015   L4   L5   STRESS ECHOCARDIOGRAM  07/2017   NORMAL STUDY.  EF 60-65%.  No stress-induced arrhythmias or wall motion abnormalities.  Normal EKG.   TONSILLECTOMY AND ADENOIDECTOMY     TOTAL ABDOMINAL HYSTERECTOMY W/ BILATERAL SALPINGOOPHORECTOMY  2008   UPPER GASTROINTESTINAL ENDOSCOPY      FAMILY HISTORY: Family History  Problem Relation Age of Onset   Heart attack Mother 91   Diabetes Mother    Rheum arthritis Mother    Glaucoma Sister    Cancer Sister    Lymphoma Sister    Melanoma Sister    Heart disease Maternal Grandmother    Colon cancer Neg Hx     SOCIAL HISTORY: Social History   Socioeconomic History   Marital status: Married    Spouse name: Not on file   Number of children: 2   Years of education: Not on file   Highest education level: Not on file  Occupational History   Occupation: Optician, dispensing: [employer]  Tobacco Use   Smoking status: Never   Smokeless tobacco: Never   Tobacco comments:    Tobacco use-no  Vaping Use   Vaping status: Never Used  Substance and Sexual Activity   Alcohol use: Yes    Comment: Social   Drug use: No   Sexual activity: Not on file  Other Topics Concern   Not on file  Social History Narrative    Moved to [place] from [place].    Caffiene occasionally.   Social Determinants of Health   Financial Resource Strain: Low Risk  (10/27/2021)   [facility], [facility] visits prior to 06/23/2022., [facility], [facility] visits prior to 06/23/2022.   Overall Financial Resource Strain (CARDIA)    Difficulty  of Paying Living Expenses: Not very hard  Food Insecurity: Low Risk  (01/28/2023)   Received from [facility]   Hunger Vital Sign    Worried About Running Out of Food in the Last Year: Never true    Ran Out of Food in the Last Year: Never true  Transportation Needs: No Transportation Needs (01/28/2023)   Received from [facility]    In the past 12 months, has lack of reliable transportation kept you from medical appointments, meetings, work or from getting things needed for daily living? : No  Physical Activity: Sufficiently Active (10/27/2021)   Received from [facility], [facility] visits prior to 06/23/2022., [facility], [facility] visits prior to 06/23/2022.   Exercise Vital Sign    Days of Exercise per Week: 4 days    Minutes of Exercise per Session: 40 min  Stress: No Stress Concern Present (10/27/2021)   Received from [facility], [facility] visits prior to 06/23/2022., [facility], [facility] visits prior to 06/23/2022.   [employer] of Occupational Health - Occupational Stress Questionnaire    Feeling of Stress : Only a little  Social Connections: Unknown (08/08/2022)   Received from [facility], [facility]   Social Network    Social Network: Not on file  Intimate Partner Violence: Unknown (08/08/2022)   Received from [facility], [facility]   HITS    Physically Hurt: Not on file    Insult or Talk Down To: Not on file    Threaten Physical Harm: Not on file    Scream or Curse: Not on file    PHYSICAL EXAM  Vitals:   02/04/23 1247  BP: (!) 147/81  Pulse: 67  Weight: 175 lb 3.2 oz (79.5 kg)  Height: 5\' 5"  (1.651 m)   Body mass index is 29.15 kg/m.  Generalized: Well developed, in no acute distress  Neurological examination  Mentation: Alert oriented to time, place, history taking. Follows all commands speech and language fluent Cranial nerve  II-XII: Pupils were equal round reactive to light. Extraocular movements were full, visual field were full on confrontational test. Facial sensation and strength were normal.  Head turning and shoulder shrug  were normal and symmetric. Motor: The motor testing reveals 5 over 5 strength of all 4 extremities. Good symmetric motor tone is noted throughout.  Sensory: Sensory testing is intact to soft touch on all 4 extremities. No evidence of extinction is noted.  Coordination: Cerebellar testing reveals good finger-nose-finger and heel-to-shin bilaterally.  Gait and station: Gait is normal.  Reflexes: Deep tendon reflexes are symmetric and normal bilaterally.   DIAGNOSTIC DATA (LABS, IMAGING, TESTING) - I reviewed patient records, labs, notes, testing and imaging myself where available.  Lab Results  Component Value Date   WBC 8.9 02/11/2015   HGB 12.6 02/11/2015   HCT 38.5 02/11/2015   MCV 90.8 02/11/2015   PLT 250 02/11/2015      Component Value Date/Time   NA 141 02/11/2015 1523   K 3.9 02/11/2015 1523   CL 107 02/11/2015 1523   CO2 25 02/11/2015 1523   GLUCOSE 95 02/11/2015 1523   BUN 12 02/11/2015 1523   CREATININE 0.75 02/11/2015 1523   CALCIUM 9.4 02/11/2015 1523   GFRNONAA >60 02/11/2015 1523   GFRAA >60 02/11/2015 1523   No results found for: "CHOL", "HDL", "LDLCALC", "LDLDIRECT", "TRIG", "CHOLHDL" No results found for: "HGBA1C" No results found for: "VITAMINB12" No results found for: "TSH"  Margie Ege, AGNP-C, DNP 02/04/2023, 1:01 PM [facility] [address] [place], [place] [address] [phone]

## 2023-02-05 LAB — C-REACTIVE PROTEIN: CRP: 1 mg/L (ref 0–10)

## 2023-02-05 LAB — SEDIMENTATION RATE: Sed Rate: 3 mm/h (ref 0–40)

## 2023-02-05 NOTE — Patient Instructions (Signed)
CRP, ESR were normal. Please follow-up with rheumatology, neurosurgery. Continue nightly CPAP usage.  Recommend minimum of 4 hours nightly utilization.  Continue current settings.  Will send an order to DME to send supplies as needed. Follow-up with me in 1 year virtually for CPAP.

## 2023-02-07 ENCOUNTER — Encounter: Payer: Medicare Other | Admitting: Neurology

## 2023-04-03 ENCOUNTER — Encounter: Payer: Self-pay | Admitting: Neurology

## 2023-05-24 ENCOUNTER — Other Ambulatory Visit: Payer: Self-pay | Admitting: Neurosurgery

## 2023-05-24 DIAGNOSIS — G500 Trigeminal neuralgia: Secondary | ICD-10-CM

## 2023-05-27 ENCOUNTER — Encounter: Payer: Self-pay | Admitting: Neurosurgery

## 2023-06-18 ENCOUNTER — Ambulatory Visit
Admission: RE | Admit: 2023-06-18 | Discharge: 2023-06-18 | Disposition: A | Discharge status: Home / Self Care | Payer: Medicare Other | Source: Ambulatory Visit | Attending: Neurosurgery | Admitting: Neurosurgery

## 2023-06-18 DIAGNOSIS — G500 Trigeminal neuralgia: Secondary | ICD-10-CM

## 2023-06-18 MED ORDER — GADOPICLENOL 0.5 MMOL/ML IV SOLN
9.0000 mL | Freq: Once | INTRAVENOUS | Status: AC | PRN
  Administered 2023-06-18: 9 mL via INTRAVENOUS

## 2023-12-12 DIAGNOSIS — G43919 Migraine, unspecified, intractable, without status migrainosus: Secondary | ICD-10-CM | POA: Insufficient documentation

## 2024-01-03 ENCOUNTER — Other Ambulatory Visit: Payer: Self-pay | Admitting: Neurosurgery

## 2024-01-22 DIAGNOSIS — R519 Headache, unspecified: Secondary | ICD-10-CM

## 2024-01-22 HISTORY — DX: Headache, unspecified: R51.9

## 2024-02-05 ENCOUNTER — Telehealth: Payer: Medicare Other | Admitting: Neurology

## 2024-02-05 ENCOUNTER — Ambulatory Visit: Admitting: Neurology

## 2024-02-05 ENCOUNTER — Encounter: Payer: Self-pay | Admitting: Neurology

## 2024-02-05 VITALS — BP 104/69 | HR 76 | Ht 65.0 in | Wt 178.0 lb

## 2024-02-05 DIAGNOSIS — G4733 Obstructive sleep apnea (adult) (pediatric): Secondary | ICD-10-CM

## 2024-02-05 DIAGNOSIS — G43919 Migraine, unspecified, intractable, without status migrainosus: Secondary | ICD-10-CM | POA: Diagnosis not present

## 2024-02-05 NOTE — Progress Notes (Signed)
 Patient: Cindy [name] Date of Birth: 08/06/1955  Reason for Visit: Follow up History from: Patient, husband Primary Neurologist: Buck  ASSESSMENT AND PLAN 68 y.o. year old female   1.  OSA on CPAP (setup August 2024) 2.  History of headaches, left-sided temporal, chronic migraine headaches, probable trigeminal neuralgia 3.  Rheumatoid arthritis   - CPAP compliance has been suboptimal due to health issues lately.  Has had significant headache and facial pain that does not allow her to comfortably wear her CPAP.  We are going to order a mask refit with a nasal pillow mask, she feels this to be more comfortable and tolerable.  She is motivated to continue CPAP.  Recommended nightly use minimum 4 hours.  Continue current settings.  When she gets back on CPAP with a new mask, reach out via MyChart and I can evaluate the data. She didn't feel as tired with CPAP use.  Follows closely with [facility] and Dr. Nancye for headaches and TN. Currently getting Botox.  Follow-up in 1 year for CPAP.  HISTORY OF PRESENT ILLNESS: Today 02/05/24 02/05/24 Cindy [name]: Here with her husband. Having trouble wearing CPAP, continues with left sided facial pain, temporal pain. Also rosacea where CPAP mask sits. With high anxiety, can't tolerate CPAP. Dr. Onetha is doing lumbar surgery in a few weeks, having left leg pain, isn't sleeping well. CPAP compliance has been very poor in the last several months. With CPAP, claims doesn't feel much different because always having headaches that takes a lot out of her. Seeing neurology at [facility], getting Botox for headaches. Thinks something with trigeminal nerve is irritated. Would like to try nasal pillow mask. Traveling to [place] soon.   02/04/23 Cindy [name]: Here for initial CPAP visit. She had a baseline sleep study through our sleep lab on 11/13/2022 which showed overall mild obstructive sleep apnea with a total AHI of 10.7/h, with moderate events during REM sleep with a REM AHI of 26.1/h, O2  nadir 77% during supine REM sleep.  She was advised to proceed with home AutoPAP therapy.   Doing well with CPAP, sometimes with allergies, hard time using CPAP mask. Nasal mask, medium size. Some fatigue for other reasons like RA. Her cat wakes her up early morning. ESS 12.  CPAP data attached shows setup date 12/06/22, 5-11 cm. AHI 0.8, leak 0.6. 80% compliance greater than 4 hours nightly.   Mentions, seeing neurologist at [facility], undergoing work up for temporal arteritis. Is being treated for RA with methotrexate and Remicade. So far CRP and ESR were normal.   The CPAP did help with energy level and alertness during the day, no change to headaches. Celebrex is helping headaches, some thought coming from neck? Pain can radiate into left jaw. No longer tingling or numb. Paw is gnawing, like tooth ache. Pain is present all the time,   Pain to left temple, to left temple, sensitive to touch, sometimes sees double vision to her left eye with reading. Did well on prednisone  taper in August, it resolved, but returned once finished prednisone .   Temporal Artery Report 01/01/23 Summary:  Absence of a halo sign in the bilateral temporal artery, although not  definitive, makes a diagnosis of temporal arteritis unlikely.   MRI of cervical spine is below. Has seen neurosurgery. Planning to have consult about cervical ESI this week. All of this started in July. Headaches in feb. Stress worsens symptoms. Due for Remicade this Wednesday.  She is on Lyrica and Cymbalta.  1.  Moderate  spinal canal stenosis at C6-C7 with additional levels of mild spinal canal stenosis as described above. Findings are similar to slightly progressed in comparison to the prior MRI from 09/12/2021.  2.  Facet and uncovertebral joint spurring contribute to varying degrees of multilevel neural foraminal stenosis as above.  3.  No cervical cord signal abnormality.   HISTORY  Cindy [name] is a 67 year old female with an underlying  complex medical history of anemia, arthritis, including rheumatoid arthritis and osteoarthritis, on Remicade infusions, hypertension, hyperlipidemia, reflux disease, depression, anxiety, coronary artery disease with status post angioplasty, history of uterine cancer, low back pain with status post lumbar discectomy in 2021 and lumbar spine fusion in 2023 pneumonia, and overweight state, who presents for follow-up consultation of her recurrent headaches.  The patient is accompanied by her husband today.  I first met her at the request of her primary care physician on 08/14/2022, at which time she reported an approximately 58-month history of recurrent mostly left-sided headaches with some radiation to the left temple area and behind both eyes.  She reported no prior history of migraines.  She was on Lyrica and Cymbalta at the time and we mutually agreed not to add any other symptomatic medication.  She had seen cardiology in the past and also ophthalmology.  She is followed by rheumatology.  She was advised to proceed with further evaluation for her new onset headaches with laboratory testing and imaging testing.  Laboratory testing showed a normal ESR of 8, CRP normal at 2, prior laboratory testing through [facility] showed a normal TSH at 1.69 on 06/19/2022.  A1c was 6.0 on 08/15/2022.     She had an MR angiogram of the head without contrast through [facility] on 09/10/2022 and I reviewed the results: Impression: Normal MRA of the brain.  She had a baseline sleep study through our sleep lab on 11/13/2022 which showed overall mild obstructive sleep apnea with a total AHI of 10.7/h, with moderate events during REM sleep with a REM AHI of 26.1/h, O2 nadir 77% during supine REM sleep.  She was advised to proceed with home AutoPAP therapy.   Today, 12/06/2022: She reports that she had a recent episode of headache with radiation of the pain to the left eye.  She has seen her optometrist, prescription has stayed the  same.  Yesterday she had that pain, it radiated upwards on the right side in the back.  She had an x-ray through her neurosurgical office of the neck.  She has not had an MRI of the neck.  She reports some numbness in the left face.  She has seen her dentist about 6 months ago but not recently.  She feels that her neck pain radiates to the right eye into the ear at times.  She does not believe she has seen her ophthalmologist in some time.  She tries to hydrate well but does complain of dry mouth.  Her primary care started her on a course of prednisone  and she is going to finish it this week.  Her primary care also ordered an ultrasound of the temporal artery.  She reports intermittent balance problems, she has not had an actual fall recently.  She does not use a walking aid.  She holds onto things or onto her husband when she is with him.  She is supposed to pick up her AutoPap machine today.   Of note, she presented to the emergency room at [facility] on 11/16/2022 with  a headache.  She also had numbness and tingling, felt off balance.  I reviewed the emergency room records. She had a CT angiogram of the head and neck through [facility] on 11/16/2022 and I reviewed the results: Impression: 1.  No acute intracranial abnormality. However, CT is relatively insensitive for the detection of acute infarct within the first 24-48 hours, and MRI may be indicated if there is high clinical suspicion.  2.  No acute arterial abnormality in the head or neck.  3.  6 mm left upper lobe pulmonary nodule. Recommend follow-up chest CT in 6-12 months.   REVIEW OF SYSTEMS: Out of a complete 14 system review of symptoms, the patient complains only of the following symptoms, and all other reviewed systems are negative.  See HPI  ALLERGIES: Allergies  Allergen Reactions   Amlodipine Swelling    Location of swelling unknown   Morphine And Codeine Itching    HOME MEDICATIONS: Outpatient  Medications Prior to Visit  Medication Sig Dispense Refill   albuterol (VENTOLIN HFA) 108 (90 Base) MCG/ACT inhaler Inhale 2 puffs into the lungs every 6 (six) hours as needed for wheezing or shortness of breath.     ALPRAZolam (XANAX) 0.5 MG tablet Take 0.5 tablets by mouth as needed. As needed for anxiety     aspirin  EC 81 MG tablet Take 81 mg by mouth daily. Swallow whole.     celecoxib (CELEBREX) 50 MG capsule Take 50 mg by mouth 2 (two) times daily.     Cholecalciferol (D3-1000) 25 MCG (1000 UT) capsule Take 1,000 Units by mouth daily.     diclofenac  Sodium (VOLTAREN ) 1 % GEL Apply 2 g topically 2 (two) times daily.     DULoxetine (CYMBALTA) 60 MG capsule Take 60 mg by mouth daily.     ezetimibe  (ZETIA ) 10 MG tablet Take 1 tablet (10 mg total) by mouth daily. 90 tablet 3   folic acid (FOLVITE) 1 MG tablet Take 1 mg by mouth daily.     hydrochlorothiazide (HYDRODIURIL) 25 MG tablet Take 25 mg by mouth daily.     inFLIXimab (REMICADE IV) Inject into the vein. Every 7 weeks     levothyroxine  (SYNTHROID ) 88 MCG tablet Take 88 mcg by mouth daily before breakfast.     lisinopril  (ZESTRIL ) 40 MG tablet Take 40 mg by mouth daily.     magnesium  oxide (MAG-OX) 400 (240 Mg) MG tablet Take 400 mg by mouth daily.     methotrexate (RHEUMATREX) 2.5 MG tablet Take 2.5 mg by mouth once a week. Caution:Chemotherapy. Protect from light.     Multiple Vitamins-Minerals (PRESERVISION AREDS PO) Take 1 tablet by mouth daily.     pantoprazole  (PROTONIX ) 40 MG tablet Take by mouth.     pregabalin (LYRICA) 75 MG capsule Take by mouth. Take 1 cap in am and 2 caps in pm.     Propylene Glycol (SYSTANE BALANCE) 0.6 % SOLN Apply 2 drops to eye daily.     Rimegepant Sulfate (NURTEC) 75 MG TBDP Take by mouth.     rosuvastatin (CRESTOR) 40 MG tablet Take 40 mg by mouth daily.     topiramate (TOPAMAX) 25 MG tablet Take 25 mg by mouth 2 (two) times daily.     VITAMIN E PO Take 1 capsule by mouth daily.     nitroGLYCERIN   (NITROSTAT ) 0.4 MG SL tablet Place 0.4 mg under the tongue every 5 (five) minutes as needed for chest pain. (Patient not taking: Reported on 02/05/2024)  No facility-administered medications prior to visit.    PAST MEDICAL HISTORY: Past Medical History:  Diagnosis Date   Anemia    Anginal pain    Anxiety    Arthritis    CAD S/P percutaneous coronary angioplasty 2006   s/p BMS PCI in 2006 - Dx (3.0x56mm Driver BMS);  CATH 1/87: mDx ~ 20% ISR, EF 65%; NORMAL STUDY.  EF 60-65%.  No stress-induced arrhythmias or wall motion abnormalities.  Normal EKG.   Depression    Dyslipidemia    Dysrhythmia    tachycardia if she misses Toprol    GERD (gastroesophageal reflux disease)    Hyperlipidemia    Hypertension    Hypothyroidism    Osteoarthritis    Shoulder and hip   Pneumonia    Uterine cancer (HCC)     PAST SURGICAL HISTORY: Past Surgical History:  Procedure Laterality Date   ABDOMINAL HYSTERECTOMY     atrial valve surgery     05/2022   BACK SURGERY     [facility], and 2021 (S1, and slipped disc)   COLONOSCOPY     CORONARY STENT INTERVENTION  2006   BMS PCI of Diag: 3.0x34mm Driver BMS   LEFT HEART CATH AND CORONARY ANGIOGRAPHY  2006   -> Resulted in PCI of Diag   LEFT HEART CATH AND CORONARY ANGIOGRAPHY  11/2010    mDx stent ok with 20% ISR, EF 65%;    POSTERIOR LUMBAR FUSION  02/23/2015   L4   L5   STRESS ECHOCARDIOGRAM  07/2017   NORMAL STUDY.  EF 60-65%.  No stress-induced arrhythmias or wall motion abnormalities.  Normal EKG.   TONSILLECTOMY AND ADENOIDECTOMY     TOTAL ABDOMINAL HYSTERECTOMY W/ BILATERAL SALPINGOOPHORECTOMY  2008   UPPER GASTROINTESTINAL ENDOSCOPY      FAMILY HISTORY: Family History  Problem Relation Age of Onset   Heart attack Mother 37   Diabetes Mother    Rheum arthritis Mother    Glaucoma Sister    Cancer Sister    Lymphoma Sister    Melanoma Sister    Heart disease Maternal Grandmother    Colon cancer Neg Hx     SOCIAL  HISTORY: Social History   Socioeconomic History   Marital status: Married    Spouse name: Not on file   Number of children: 2   Years of education: Not on file   Highest education level: Not on file  Occupational History   Occupation: Optician, dispensing: [employer]  Tobacco Use   Smoking status: Never   Smokeless tobacco: Never   Tobacco comments:    Tobacco use-no  Vaping Use   Vaping status: Never Used  Substance and Sexual Activity   Alcohol use: Yes    Comment: Social   Drug use: No   Sexual activity: Not on file  Other Topics Concern   Not on file  Social History Narrative    Moved to [place] from [place] .    Caffiene occasionally.   Social Drivers of Health   Financial Resource Strain: Low Risk  (10/27/2021)   Received from [facility], [facility] visits prior to 06/23/2022.   Overall Financial Resource Strain (CARDIA)    Difficulty of Paying Living Expenses: Not very hard  Food Insecurity: Low Risk  (07/23/2023)   Received from [facility]   Hunger Vital Sign    Within the past 12 months, you worried that your food would run out before you got money to  buy more: Never true    Within the past 12 months, the food you bought just didn't last and you didn't have money to get more. : Never true  Transportation Needs: No Transportation Needs (07/23/2023)   Received from [facility]    In the past 12 months, has lack of reliable transportation kept you from medical appointments, meetings, work or from getting things needed for daily living? : No  Physical Activity: Sufficiently Active (10/27/2021)   Received from [facility], [facility] visits prior to 06/23/2022.   Exercise Vital Sign    On average, how many days per week do you engage in moderate to strenuous exercise (like a brisk walk)?: 4 days    On average, how many minutes do you engage in exercise at this level?: 40 min  Stress: No Stress Concern  Present (10/27/2021)   Received from [facility], [facility] visits prior to 06/23/2022.   [employer] of Occupational Health - Occupational Stress Questionnaire    Feeling of Stress : Only a little  Social Connections: Unknown (08/08/2022)   Received from [facility]   Social Network    Social Network: Not on file  Intimate Partner Violence: Unknown (08/08/2022)   Received from [facility]   HITS    Physically Hurt: Not on file    Insult or Talk Down To: Not on file    Threaten Physical Harm: Not on file    Scream or Curse: Not on file    PHYSICAL EXAM  Vitals:   02/05/24 1028  BP: 104/69  Pulse: 76  Weight: 178 lb (80.7 kg)  Height: 5' 5 (1.651 m)    Body mass index is 29.62 kg/m.  Generalized: Well developed, in no acute distress  Neurological examination  Mentation: Alert oriented to time, place, history taking. Follows all commands speech and language fluent Cranial nerve II-XII: Pupils were equal round reactive to light.  Motor: Moves all extremities independent Gait and station: Gait is normal.   DIAGNOSTIC DATA (LABS, IMAGING, TESTING) - I reviewed patient records, labs, notes, testing and imaging myself where available.  Lab Results  Component Value Date   WBC 8.9 02/11/2015   HGB 12.6 02/11/2015   HCT 38.5 02/11/2015   MCV 90.8 02/11/2015   PLT 250 02/11/2015      Component Value Date/Time   NA 141 02/11/2015 1523   K 3.9 02/11/2015 1523   CL 107 02/11/2015 1523   CO2 25 02/11/2015 1523   GLUCOSE 95 02/11/2015 1523   BUN 12 02/11/2015 1523   CREATININE 0.75 02/11/2015 1523   CALCIUM  9.4 02/11/2015 1523   GFRNONAA >60 02/11/2015 1523   GFRAA >60 02/11/2015 1523   No results found for: CHOL, HDL, LDLCALC, LDLDIRECT, TRIG, CHOLHDL No results found for: YHAJ8R No results found for: VITAMINB12 No results found for: TSH  Lauraine Born, AGNP-C, DNP 02/05/2024, 10:51 AM [facility] [address], [address] [place], [place] [address] [phone]

## 2024-02-05 NOTE — Progress Notes (Signed)
 Community message sent to [facility] that mask refit order placed.

## 2024-02-05 NOTE — Patient Instructions (Addendum)
 Mask refit for comfort  Recommend CPAP use nightly minimum 4 hours After mask refit contact me and I can review the data Otherwise follow-up in 1 year.  Thanks

## 2024-02-26 NOTE — Progress Notes (Signed)
 Surgical Instructions   Your procedure is scheduled on March 06, 2024. Report to [facility] Main Entrance A at 5:30 A.M., then check in with the Admitting office. Any questions or running late day of surgery: call [phone]  Questions prior to your surgery date: call [phone], Monday-Friday, 8am-4pm. If you experience any cold or flu symptoms such as cough, fever, chills, shortness of breath, etc. between now and your scheduled surgery, please notify us  at the above number.     Remember:  Do not eat or drink after midnight the night before your surgery. No gum, mints, or hard candy.      Take these medicines the morning of surgery with A SIP OF WATER: DULoxetine (CYMBALTA)  ezetimibe  (ZETIA )  levothyroxine  (SYNTHROID )  pantoprazole  (PROTONIX )  pregabalin (LYRICA)  rosuvastatin (CRESTOR)  topiramate (TOPAMAX)  sodium chloride  HYPERTONIC 3 % nebulizer solution   May take these medicines IF NEEDED: albuterol (VENTOLIN HFA) 108 (90 Base) MCG/ACT inhaler - please bring inhaler to the hospital with you.  ALPRAZolam (XANAX)  nitroGLYCERIN  (NITROSTAT ) 0.4 MG SL tablet- please contact us  if used prior to surgery- [phone] Rimegepant Sulfate (NURTEC)   FOLLOW your surgeon's instructions on STOPPING Aspirin  and methotrexate (RHEUMATREX) prior to surgery. If no instructions were given, please contact your surgeon's office.   One week prior to surgery, STOP taking any Aleve, Naproxen, Ibuprofen, Motrin, Advil, Goody's, BC's, all herbal medications, fish oil, and non-prescription vitamins, including Celebrex (Celecoxib) and diclofenac  Sodium (VOLTAREN                       Do NOT Smoke (Tobacco/Vaping) for 24 hours prior to your procedure.  If you use a CPAP at night, you may bring your mask/headgear for your overnight stay.   You will be asked to remove any contacts, glasses, piercing's, hearing aid's, dentures/partials prior to surgery. Please bring cases for these items  if needed.    Patients discharged the day of surgery will not be allowed to drive home, and someone needs to stay with them for 24 hours.  SURGICAL WAITING ROOM VISITATION Patients may have no more than 2 support people in the waiting area - these visitors may rotate.   Pre-op nurse will coordinate an appropriate time for 1 ADULT support person, who may not rotate, to accompany patient in pre-op.  Children under the age of 60 must have an adult with them who is not the patient and must remain in the main waiting area with an adult.  If the patient needs to stay at the hospital during part of their recovery, the visitor guidelines for inpatient rooms apply.  Please refer to the [facility] website for the visitor guidelines for any additional information.   If you received a COVID test during your pre-op visit  it is requested that you wear a mask when out in public, stay away from anyone that may not be feeling well and notify your surgeon if you develop symptoms. If you have been in contact with anyone that has tested positive in the last 10 days please notify you surgeon.      Pre-operative 4 CHG Bathing Instructions   You can play a key role in reducing the risk of infection after surgery. Your skin needs to be as free of germs as possible. You can reduce the number of germs on your skin by washing with CHG (chlorhexidine  gluconate) soap before surgery. CHG is an antiseptic soap that kills germs and continues to kill  germs even after washing.   DO NOT use if you have an allergy to chlorhexidine /CHG or antibacterial soaps. If your skin becomes reddened or irritated, stop using the CHG and notify one of our RNs at [phone].   Please shower with the CHG soap starting 4 days before surgery using the following schedule:     Please keep in mind the following:  DO NOT shave, including legs and underarms, starting the day of your first shower.   You may shave your face at any point  before/day of surgery.  Place clean sheets on your bed the day you start using CHG soap. Use a clean washcloth (not used since being washed) for each shower. DO NOT sleep with pets once you start using the CHG.   CHG Shower Instructions:  Wash your face and private area with normal soap. If you choose to wash your hair, wash first with your normal shampoo.  After you use shampoo/soap, rinse your hair and body thoroughly to remove shampoo/soap residue.  Turn the water OFF and apply  bottle of CHG soap to a CLEAN washcloth.  Apply CHG soap ONLY FROM YOUR NECK DOWN TO YOUR TOES (washing for 3-5 minutes)  DO NOT use CHG soap on face, private areas, open wounds, or sores.  Pay special attention to the area where your surgery is being performed.  If you are having back surgery, having someone wash your back for you may be helpful. Wait 2 minutes after CHG soap is applied, then you may rinse off the CHG soap.  Pat dry with a clean towel  Put on clean clothes/pajamas   If you choose to wear lotion, please use ONLY the CHG-compatible lotions that are listed below.  Additional instructions for the day of surgery:  If you choose, you may shower the morning of surgery with an antibacterial soap.  DO NOT APPLY any lotions, deodorants, cologne, or perfumes.   Do not bring valuables to the hospital. [facility] is not responsible for any belongings/valuables. Do not wear nail polish, gel polish, artificial nails, or any other type of covering on natural nails (fingers and toes) Do not wear jewelry or makeup Put on clean/comfortable clothes.  Please brush your teeth.  Ask your nurse before applying any prescription medications to the skin.     CHG Compatible Lotions   Aveeno Moisturizing lotion  Cetaphil Moisturizing Cream  Cetaphil Moisturizing Lotion  Clairol Herbal Essence Moisturizing Lotion, Dry Skin  Clairol Herbal Essence Moisturizing Lotion, Extra Dry Skin  Clairol Herbal Essence  Moisturizing Lotion, Normal Skin  Curel Age Defying Therapeutic Moisturizing Lotion with Alpha Hydroxy  Curel Extreme Care Body Lotion  Curel Soothing Hands Moisturizing Hand Lotion  Curel Therapeutic Moisturizing Cream, Fragrance-Free  Curel Therapeutic Moisturizing Lotion, Fragrance-Free  Curel Therapeutic Moisturizing Lotion, Original Formula  Eucerin Daily Replenishing Lotion  Eucerin Dry Skin Therapy Plus Alpha Hydroxy Crme  Eucerin Dry Skin Therapy Plus Alpha Hydroxy Lotion  Eucerin Original Crme  Eucerin Original Lotion  Eucerin Plus Crme Eucerin Plus Lotion  Eucerin TriLipid Replenishing Lotion  Keri Anti-Bacterial Hand Lotion  Keri Deep Conditioning Original Lotion Dry Skin Formula Softly Scented  Keri Deep Conditioning Original Lotion, Fragrance Free Sensitive Skin Formula  Keri Lotion Fast Absorbing Fragrance Free Sensitive Skin Formula  Keri Lotion Fast Absorbing Softly Scented Dry Skin Formula  Keri Original Lotion  Keri Skin Renewal Lotion Keri Silky Smooth Lotion  Keri Silky Smooth Sensitive Skin Lotion  Nivea Body Creamy Conditioning Oil  Nivea  Body Extra Enriched Lotion  Capital One Body Original Lotion  Nivea Body Sheer Moisturizing Lotion Nivea Crme  Nivea Skin Firming Lotion  NutraDerm 30 Skin Lotion  NutraDerm Skin Lotion  NutraDerm Therapeutic Skin Cream  NutraDerm Therapeutic Skin Lotion  ProShield Protective Hand Cream  Provon moisturizing lotion  Please read over the following fact sheets that you were given.

## 2024-02-27 ENCOUNTER — Other Ambulatory Visit: Payer: Self-pay

## 2024-02-27 ENCOUNTER — Encounter (HOSPITAL_COMMUNITY)
Admission: RE | Admit: 2024-02-27 | Discharge: 2024-02-27 | Disposition: A | Discharge status: Home / Self Care | Source: Ambulatory Visit | Attending: Neurosurgery | Admitting: Neurosurgery

## 2024-02-27 ENCOUNTER — Encounter (HOSPITAL_COMMUNITY): Payer: Self-pay

## 2024-02-27 DIAGNOSIS — Z79899 Other long term (current) drug therapy: Secondary | ICD-10-CM | POA: Diagnosis not present

## 2024-02-27 DIAGNOSIS — I2510 Atherosclerotic heart disease of native coronary artery without angina pectoris: Secondary | ICD-10-CM | POA: Insufficient documentation

## 2024-02-27 DIAGNOSIS — I498 Other specified cardiac arrhythmias: Secondary | ICD-10-CM | POA: Diagnosis not present

## 2024-02-27 DIAGNOSIS — E876 Hypokalemia: Secondary | ICD-10-CM | POA: Diagnosis not present

## 2024-02-27 DIAGNOSIS — K219 Gastro-esophageal reflux disease without esophagitis: Secondary | ICD-10-CM | POA: Insufficient documentation

## 2024-02-27 DIAGNOSIS — J479 Bronchiectasis, uncomplicated: Secondary | ICD-10-CM | POA: Insufficient documentation

## 2024-02-27 DIAGNOSIS — Z79631 Long term (current) use of antimetabolite agent: Secondary | ICD-10-CM | POA: Insufficient documentation

## 2024-02-27 DIAGNOSIS — E785 Hyperlipidemia, unspecified: Secondary | ICD-10-CM | POA: Insufficient documentation

## 2024-02-27 DIAGNOSIS — D649 Anemia, unspecified: Secondary | ICD-10-CM | POA: Insufficient documentation

## 2024-02-27 DIAGNOSIS — G4733 Obstructive sleep apnea (adult) (pediatric): Secondary | ICD-10-CM | POA: Diagnosis not present

## 2024-02-27 DIAGNOSIS — E039 Hypothyroidism, unspecified: Secondary | ICD-10-CM | POA: Diagnosis not present

## 2024-02-27 DIAGNOSIS — I10 Essential (primary) hypertension: Secondary | ICD-10-CM | POA: Diagnosis not present

## 2024-02-27 DIAGNOSIS — I440 Atrioventricular block, first degree: Secondary | ICD-10-CM | POA: Diagnosis not present

## 2024-02-27 DIAGNOSIS — Z79620 Long term (current) use of immunosuppressive biologic: Secondary | ICD-10-CM | POA: Insufficient documentation

## 2024-02-27 DIAGNOSIS — Z01818 Encounter for other preprocedural examination: Secondary | ICD-10-CM | POA: Diagnosis present

## 2024-02-27 DIAGNOSIS — M069 Rheumatoid arthritis, unspecified: Secondary | ICD-10-CM | POA: Diagnosis not present

## 2024-02-27 DIAGNOSIS — I493 Ventricular premature depolarization: Secondary | ICD-10-CM | POA: Insufficient documentation

## 2024-02-27 HISTORY — DX: Sleep apnea, unspecified: G47.30

## 2024-02-27 HISTORY — DX: Bronchiectasis, uncomplicated: J47.9

## 2024-02-27 HISTORY — DX: Rheumatoid arthritis, unspecified: M06.9

## 2024-02-27 HISTORY — DX: Unspecified cataract: H26.9

## 2024-02-27 HISTORY — DX: Unspecified hearing loss, unspecified ear: H91.90

## 2024-02-27 LAB — BASIC METABOLIC PANEL WITH GFR
Anion gap: 9 (ref 5–15)
BUN: 15 mg/dL (ref 8–23)
CO2: 25 mmol/L (ref 22–32)
Calcium: 9 mg/dL (ref 8.9–10.3)
Chloride: 105 mmol/L (ref 98–111)
Creatinine, Ser: 0.94 mg/dL (ref 0.44–1.00)
GFR, Estimated: >60 mL/min (ref >60)
Glucose, Bld: 101 mg/dL — ABNORMAL HIGH (ref 70–99)
Potassium: 3.3 mmol/L — ABNORMAL LOW (ref 3.5–5.1)
Sodium: 139 mmol/L (ref 135–145)

## 2024-02-27 LAB — TYPE AND SCREEN
ABO/RH(D): A POS
Antibody Screen: NEGATIVE

## 2024-02-27 LAB — SURGICAL PCR SCREEN
MRSA, PCR: NEGATIVE
Staphylococcus aureus: POSITIVE — AB

## 2024-02-27 LAB — CBC
HCT: 35.7 % — ABNORMAL LOW (ref 36.0–46.0)
Hemoglobin: 11.7 g/dL — ABNORMAL LOW (ref 12.0–15.0)
MCH: 30 pg (ref 26.0–34.0)
MCHC: 32.8 g/dL (ref 30.0–36.0)
MCV: 91.5 fL (ref 80.0–100.0)
Platelets: 232 K/uL (ref 150–400)
RBC: 3.9 MIL/uL (ref 3.87–5.11)
RDW: 13.3 % (ref 11.5–15.5)
WBC: 10.4 K/uL (ref 4.0–10.5)
nRBC: 0 % (ref 0.0–0.2)

## 2024-02-27 NOTE — Progress Notes (Signed)
 Patient's husband Prentice is present during PAT appointment per patient request.  PCP - Shawn Lazoff, DO Cardiologist - Dr Hester Loges (clearance 01/13/24) Neurology - Dr Heron Mount Pulmonology - Dr Vicenta Lennert Rheumatology - Dr Marinda Ang   Chest x-ray - n/a EKG - 02/27/24 Stress Test - 09/04/23 CE ECHO - 09/04/23 [facility] Cardiac Cath - 11/2010  ICD Pacemaker/Loop - n/a  Sleep Study -  Yes CPAP - does not use CPAP, pt getting a new mask to use with machine.  Diabetes - n/a   Blood Thinner Instructions:  n/a  Aspirin  & Rheumatrex Instructions: Follow your surgeon's instructions on when to stop aspirin  & rheumatrex prior to surgery,  If no instructions were given by your surgeon then you will need to call the office for those instructions.  NPO  Anesthesia review: Yes.  Spoke with Isaiah, [place] regarding patient's history of Bronchiectasis. Isaiah does not need to see patient at PAT appt.  STOP now taking any Aspirin  (unless otherwise instructed by your surgeon), Aleve, Celebrex, Naproxen, Ibuprofen, Motrin, Advil, Goody's, BC's, all herbal medications, fish oil, and all vitamins.   Coronavirus Screening Do you have any of the following symptoms:  Cough yes/no: No Fever (>100.19F)  yes/no: No Runny nose yes/no: No Sore throat yes/no: No Difficulty breathing/shortness of breath  yes/no: No  Have you traveled in the last 14 days and where? yes/no: No  Patient and Husband Prentice verbalized understanding of instructions that were given to them at the PAT appointment. Patient was also instructed that they will need to review over the PAT instructions again at home before surgery.

## 2024-02-28 NOTE — Progress Notes (Signed)
 Anesthesia Chart Review:  68 year old female follows with cardiology at [facility] for history of HTN, HLD, CAD s/p PCI to diagonal 2006.  Stress echo 08/2023 negative for ischemia.  Normal LV systolic function.  Clearance per telephone encounter 01/03/2024 states, Cindy [name] is having a non-cardiac surgical procedure for L3-4 FUSION and requires risk stratification.  Clinical risk factors (1 point each) Patient has: -CAD: Yes -STROKE/TIA: No -CHF (compensated or prior): No -CKD (Cr >2): No -Insulin dependent DM: No -High risk surgery (any vascular, abdominal or thoracic): Yes. RCRI score: 2. Intermediate Risk. Functional capacity: >4 mets without angina as determined by exercise capacity The patient achieved 91 % of maximum predicted heart rate.  The METS achieved was 7.4.  Exercise capacity was average.  Resting EKG is sinus rhythm with frequent PAC s and PVC s. There were no  diagnostic ST segment  changes with stress.  There was normal LV function and wall motion with rest and stress. Negative stress EKG and stress ECHO for inducible ischemia.  - LVEF 09/04/2023 55-60%. No further cardiac testing is warranted. Patient should continue betablocker through surgery as clinically able if applicable. As per RCRI protocol.  Follows with pulmonology at [facility] for history of bronchiectasis and OSA on CPAP.  Last seen by Dr. Alaine on 02/04/2024.  Upcoming surgery discussed.  Per note, Cindy [name] is here to see me because she has upcoming back surgery and is concerned about increased mucociliary mucus retention around the time of surgery. This is a reasonable concern and I think she would benefit from adding hypertonic saline during the perioperative and convalescent/rehab phase after her surgery.  Bronchiectasis: Chronic. - Continue flutter valve therapy twice daily, deep breaths and steady blowing for 10 seconds, 10 times per session. - Add hypertonic saline nebulizer therapy twice daily to thin mucus and aid  clearance. - Use hypertonic saline nebulizer before surgery to aid mucus clearance. - Consider therapy vest if issues persist.  She was also noted to have had a recent trip to [place] and an indeterminant QuantiFERON gold test result on 01/02/2024.  Repeat QuantiFERON was done 02/06/2024 and was negative.  Other pertinent history includes chronic migraines, rheumatoid arthritis maintained on methotrexate and Remicade, hypothyroidism, GERD on PPI.  Preop labs reviewed, mild hypokalemia potassium 3.3, mild anemia with hemoglobin 11.7, otherwise unremarkable.  EKG 02/27/2024: Sinus rhythm with sinus arrhythmia with 1st degree A-V block with frequent Premature ventricular complexes.  Rate 82.  Stress echo 09/04/2023: SUMMARY  The patient had no chest pain.  The patient achieved 91 % of maximum predicted heart rate.  The METS achieved was 7.4.  Exercise capacity was average.  Resting EKG is sinus rhythm with frequent PAC s and PVC s. There were no diagnostic ST segment  changes with stress.  There was normal LV function and wall motion with rest and stress.  Negative stress EKG and stress ECHO for inducible ischemia.   TTE 09/04/2023 (Care Everywhere): SUMMARY  The left ventricular size is normal with normal left ventricular wall thickness.  Left ventricular systolic function is normal.  The right ventricle is normal in size and function.  There is mild mitral regurgitation.  The aortic sinus is normal size.  Mildly dilated ascending aorta at 4.0cm  There is no pericardial effusion.     [name] [facility] Stay [facility]/Anesthesiology Phone [phone] 02/28/2024 3:26 PM

## 2024-02-28 NOTE — Anesthesia Preprocedure Evaluation (Addendum)
 Anesthesia Evaluation  Patient identified by MRN, date of birth, ID band Patient awake    Reviewed: Allergy & Precautions, NPO status , Patient's Chart, lab work & pertinent test results  History of Anesthesia Complications Negative for: history of anesthetic complications  Airway Mallampati: II  TM Distance: >3 FB Neck ROM: Full    Dental no notable dental hx. (+) Teeth Intact, Dental Advisory Given, Implants   Pulmonary sleep apnea and Continuous Positive Airway Pressure Ventilation  Hx of bronchiectasis   Pulmonary exam normal breath sounds clear to auscultation       Cardiovascular Exercise Tolerance: Good METS: 7 - 9 Mets hypertension, Pt. on medications + CAD and + Cardiac Stents  Normal cardiovascular exam Rhythm:Regular Rate:Normal  02/27/2024  EKG: Sinus rhythm with sinus arrhythmia with 1st degree A-V block with frequent Premature ventricular complexes.  Rate 82  08/2023 Stress echo.  There was normal LV function and wall motion with rest and stress.  Negative stress EKG and stress ECHO for inducible ischemia.      Neuro/Psych  Headaches PSYCHIATRIC DISORDERS Anxiety Depression       GI/Hepatic ,GERD  Medicated and Controlled,,  Endo/Other  Hypothyroidism    Renal/GU Lab Results      Component                Value               Date                      NA                       139                 02/27/2024                CL                       105                 02/27/2024                K                        3.3 (L)             02/27/2024                CO2                      25                  02/27/2024                BUN                      15                  02/27/2024                CREATININE               0.94                02/27/2024                GFRNONAA                 >  60                 02/27/2024                CALCIUM                   9.0                 02/27/2024                 GLUCOSE                  101 (H)             02/27/2024                Musculoskeletal  (+) Arthritis , Rheumatoid disorders,    Abdominal   Peds  Hematology Lab Results      Component                Value               Date                      WBC                      10.4                02/27/2024                HGB                      11.7 (L)            02/27/2024                HCT                      35.7 (L)            02/27/2024                MCV                      91.5                02/27/2024                PLT                      232                 02/27/2024       Type and screen available         Anesthesia Other Findings All: amlodipine,   Reproductive/Obstetrics                              Anesthesia Physical Anesthesia Plan  ASA: 2  Anesthesia Plan: General   Post-op Pain Management: Ketamine IV* and Tylenol  PO (pre-op)*   Induction:   PONV Risk Score and Plan: Treatment may vary due to age or medical condition, Ondansetron , Midazolam  and Dexamethasone   Airway Management Planned: Oral ETT  Additional Equipment: None  Intra-op Plan:   Post-operative Plan: Extubation in OR  Informed Consent: I have reviewed the patients History and Physical, chart, labs and discussed the procedure including the risks, benefits  and alternatives for the proposed anesthesia with the patient or authorized representative who has indicated his/her understanding and acceptance.     Dental advisory given  Plan Discussed with: CRNA and Surgeon  Anesthesia Plan Comments: (2 x18g IV)         Anesthesia Quick Evaluation

## 2024-03-05 NOTE — Progress Notes (Signed)
------------------------------------------  [facility] PROCEDURAL EXPEDITER NOTE-------------------------------------------------  Patient Name: Cindy [name] Patient DOB: 01-22-1956 Today's Date: @TODAY @   Chart reviewed:  Yes  Documentation gaps: n/a Orders in place:  Yes  Communication with surgical team if no orders: n/a Labs, test, and orders reviewed: yes Requires surgical clearance:  No What type of clearance: n/a Clearance received: n/a Patient status: pre op   Barriers noted:n/a  Intervention provided by [facility] team: n/a Barrier resolved:  Yes   Ronal Bald, RN [employer]

## 2024-03-06 ENCOUNTER — Inpatient Hospital Stay (HOSPITAL_COMMUNITY): Payer: Self-pay | Admitting: Anesthesiology

## 2024-03-06 ENCOUNTER — Ambulatory Visit (HOSPITAL_COMMUNITY)

## 2024-03-06 ENCOUNTER — Ambulatory Visit (HOSPITAL_COMMUNITY): Payer: Self-pay | Admitting: Vascular Surgery

## 2024-03-06 ENCOUNTER — Inpatient Hospital Stay (HOSPITAL_COMMUNITY)
Admission: AD | Admit: 2024-03-06 | Discharge: 2024-03-12 | DRG: 451 | Disposition: A | Discharge status: Home / Self Care | Attending: Neurosurgery | Admitting: Neurosurgery

## 2024-03-06 ENCOUNTER — Encounter (HOSPITAL_COMMUNITY): Payer: Self-pay | Admitting: Neurosurgery

## 2024-03-06 ENCOUNTER — Other Ambulatory Visit: Payer: Self-pay

## 2024-03-06 ENCOUNTER — Encounter (HOSPITAL_COMMUNITY)
Admission: AD | Disposition: A | Discharge status: Home / Self Care | Payer: Self-pay | Source: Home / Self Care | Attending: Neurosurgery

## 2024-03-06 DIAGNOSIS — M532X6 Spinal instabilities, lumbar region: Secondary | ICD-10-CM | POA: Diagnosis present

## 2024-03-06 DIAGNOSIS — Z833 Family history of diabetes mellitus: Secondary | ICD-10-CM | POA: Diagnosis not present

## 2024-03-06 DIAGNOSIS — Z7982 Long term (current) use of aspirin: Secondary | ICD-10-CM | POA: Diagnosis not present

## 2024-03-06 DIAGNOSIS — M51369 Other intervertebral disc degeneration, lumbar region without mention of lumbar back pain or lower extremity pain: Secondary | ICD-10-CM | POA: Diagnosis present

## 2024-03-06 DIAGNOSIS — E039 Hypothyroidism, unspecified: Secondary | ICD-10-CM | POA: Diagnosis present

## 2024-03-06 DIAGNOSIS — Z8249 Family history of ischemic heart disease and other diseases of the circulatory system: Secondary | ICD-10-CM

## 2024-03-06 DIAGNOSIS — E785 Hyperlipidemia, unspecified: Secondary | ICD-10-CM | POA: Diagnosis present

## 2024-03-06 DIAGNOSIS — Z90722 Acquired absence of ovaries, bilateral: Secondary | ICD-10-CM | POA: Diagnosis not present

## 2024-03-06 DIAGNOSIS — Z981 Arthrodesis status: Secondary | ICD-10-CM

## 2024-03-06 DIAGNOSIS — Z955 Presence of coronary angioplasty implant and graft: Secondary | ICD-10-CM | POA: Diagnosis not present

## 2024-03-06 DIAGNOSIS — G9609 Other spinal cerebrospinal fluid leak: Secondary | ICD-10-CM | POA: Diagnosis not present

## 2024-03-06 DIAGNOSIS — M48061 Spinal stenosis, lumbar region without neurogenic claudication: Secondary | ICD-10-CM | POA: Diagnosis present

## 2024-03-06 DIAGNOSIS — M48062 Spinal stenosis, lumbar region with neurogenic claudication: Secondary | ICD-10-CM

## 2024-03-06 DIAGNOSIS — Z885 Allergy status to narcotic agent status: Secondary | ICD-10-CM | POA: Diagnosis not present

## 2024-03-06 DIAGNOSIS — Z01818 Encounter for other preprocedural examination: Principal | ICD-10-CM

## 2024-03-06 DIAGNOSIS — Z8542 Personal history of malignant neoplasm of other parts of uterus: Secondary | ICD-10-CM

## 2024-03-06 DIAGNOSIS — Z888 Allergy status to other drugs, medicaments and biological substances status: Secondary | ICD-10-CM

## 2024-03-06 DIAGNOSIS — G4730 Sleep apnea, unspecified: Secondary | ICD-10-CM | POA: Diagnosis present

## 2024-03-06 DIAGNOSIS — Z90710 Acquired absence of both cervix and uterus: Secondary | ICD-10-CM | POA: Diagnosis not present

## 2024-03-06 DIAGNOSIS — Z79899 Other long term (current) drug therapy: Secondary | ICD-10-CM

## 2024-03-06 DIAGNOSIS — Z79890 Hormone replacement therapy (postmenopausal): Secondary | ICD-10-CM | POA: Diagnosis not present

## 2024-03-06 DIAGNOSIS — H9190 Unspecified hearing loss, unspecified ear: Secondary | ICD-10-CM | POA: Diagnosis present

## 2024-03-06 DIAGNOSIS — I10 Essential (primary) hypertension: Secondary | ICD-10-CM | POA: Diagnosis present

## 2024-03-06 DIAGNOSIS — M545 Low back pain, unspecified: Secondary | ICD-10-CM

## 2024-03-06 DIAGNOSIS — Z9889 Other specified postprocedural states: Secondary | ICD-10-CM

## 2024-03-06 LAB — ABO/RH: ABO/RH(D): A POS

## 2024-03-06 SURGERY — POSTERIOR LUMBAR FUSION 1 WITH HARDWARE REMOVAL
Anesthesia: General | Site: Back

## 2024-03-06 MED ORDER — PANTOPRAZOLE SODIUM 40 MG IV SOLR: 40.0000 mg | Freq: Every day | INTRAVENOUS | Status: DC

## 2024-03-06 MED ORDER — KETAMINE HCL 50 MG/5ML IJ SOSY
PREFILLED_SYRINGE | INTRAMUSCULAR | Status: AC
  Filled 2024-03-06: qty 5

## 2024-03-06 MED ORDER — ROSUVASTATIN CALCIUM 20 MG PO TABS
40.0000 mg | ORAL_TABLET | Freq: Every day | ORAL | Status: DC
  Administered 2024-03-06 – 2024-03-11 (×6): 40 mg via ORAL
  Filled 2024-03-06 (×6): qty 2

## 2024-03-06 MED ORDER — CHLORHEXIDINE GLUCONATE 0.12 % MT SOLN
OROMUCOSAL | Status: AC
  Administered 2024-03-06: 15 mL via OROMUCOSAL
  Filled 2024-03-06: qty 15

## 2024-03-06 MED ORDER — VITAMIN D 25 MCG (1000 UNIT) PO TABS
1000.0000 [IU] | ORAL_TABLET | Freq: Every day | ORAL | Status: DC
  Administered 2024-03-07 – 2024-03-12 (×6): 1000 [IU] via ORAL
  Filled 2024-03-06 (×6): qty 1

## 2024-03-06 MED ORDER — MENTHOL 3 MG MT LOZG: 1.0000 | LOZENGE | OROMUCOSAL | Status: DC | PRN

## 2024-03-06 MED ORDER — ACETAMINOPHEN 10 MG/ML IV SOLN: 1000.0000 mg | Freq: Once | INTRAVENOUS | Status: DC | PRN

## 2024-03-06 MED ORDER — POTASSIUM CHLORIDE 10 MEQ/100ML IV SOLN
INTRAVENOUS | Status: DC | PRN
  Administered 2024-03-06: 10 meq via INTRAVENOUS

## 2024-03-06 MED ORDER — SODIUM CHLORIDE 0.9% FLUSH
3.0000 mL | Freq: Two times a day (BID) | INTRAVENOUS | Status: DC
  Administered 2024-03-06 – 2024-03-12 (×12): 3 mL via INTRAVENOUS

## 2024-03-06 MED ORDER — ONDANSETRON HCL 4 MG/2ML IJ SOLN: 4.0000 mg | Freq: Four times a day (QID) | INTRAMUSCULAR | Status: DC | PRN

## 2024-03-06 MED ORDER — PHENYLEPHRINE 80 MCG/ML (10ML) SYRINGE FOR IV PUSH (FOR BLOOD PRESSURE SUPPORT)
PREFILLED_SYRINGE | INTRAVENOUS | Status: DC | PRN
  Administered 2024-03-06 (×2): 80 ug via INTRAVENOUS
  Administered 2024-03-06: 160 ug via INTRAVENOUS
  Administered 2024-03-06: 80 ug via INTRAVENOUS

## 2024-03-06 MED ORDER — PHENYLEPHRINE HCL-NACL 20-0.9 MG/250ML-% IV SOLN
INTRAVENOUS | Status: DC | PRN
  Administered 2024-03-06: 25 ug/min via INTRAVENOUS

## 2024-03-06 MED ORDER — ALBUMIN HUMAN 5 % IV SOLN: INTRAVENOUS | Status: DC | PRN

## 2024-03-06 MED ORDER — HYDROMORPHONE HCL 1 MG/ML IJ SOLN
0.5000 mg | INTRAMUSCULAR | Status: DC | PRN
  Administered 2024-03-06: 0.5 mg via INTRAVENOUS
  Filled 2024-03-06: qty 0.5

## 2024-03-06 MED ORDER — OXYCODONE HCL 5 MG PO TABS: 5.0000 mg | ORAL_TABLET | Freq: Once | ORAL | Status: DC | PRN

## 2024-03-06 MED ORDER — ALBUTEROL SULFATE (2.5 MG/3ML) 0.083% IN NEBU: 2.5000 mg | INHALATION_SOLUTION | Freq: Four times a day (QID) | RESPIRATORY_TRACT | Status: DC | PRN

## 2024-03-06 MED ORDER — PROPOFOL 10 MG/ML IV BOLUS
INTRAVENOUS | Status: AC
Start: 2024-03-06 — End: 2024-03-06
  Filled 2024-03-06: qty 20

## 2024-03-06 MED ORDER — ACETAMINOPHEN 325 MG PO TABS
650.0000 mg | ORAL_TABLET | ORAL | Status: DC | PRN
  Administered 2024-03-10: 650 mg via ORAL
  Filled 2024-03-06: qty 2

## 2024-03-06 MED ORDER — LOSARTAN POTASSIUM 50 MG PO TABS
50.0000 mg | ORAL_TABLET | Freq: Every day | ORAL | Status: DC
  Administered 2024-03-07 – 2024-03-12 (×4): 50 mg via ORAL
  Filled 2024-03-06 (×6): qty 1

## 2024-03-06 MED ORDER — DEXAMETHASONE SOD PHOSPHATE PF 10 MG/ML IJ SOLN
INTRAMUSCULAR | Status: DC | PRN
  Administered 2024-03-06: 10 mg via INTRAVENOUS

## 2024-03-06 MED ORDER — CEFAZOLIN SODIUM-DEXTROSE 2-4 GM/100ML-% IV SOLN
2.0000 g | INTRAVENOUS | Status: AC
  Administered 2024-03-06: 2 g via INTRAVENOUS
  Filled 2024-03-06: qty 100

## 2024-03-06 MED ORDER — CHLORHEXIDINE GLUCONATE 4 % EX SOLN: 1.0000 | CUTANEOUS | 1 refills | Status: AC

## 2024-03-06 MED ORDER — PROPYLENE GLYCOL 0.6 % OP SOLN: 2.0000 [drp] | Freq: Two times a day (BID) | OPHTHALMIC | Status: DC | PRN

## 2024-03-06 MED ORDER — ONDANSETRON HCL 4 MG/2ML IJ SOLN
INTRAMUSCULAR | Status: DC | PRN
  Administered 2024-03-06: 4 mg via INTRAVENOUS

## 2024-03-06 MED ORDER — CHLORHEXIDINE GLUCONATE 0.12 % MT SOLN: 15.0000 mL | Freq: Once | OROMUCOSAL | Status: AC

## 2024-03-06 MED ORDER — TOPIRAMATE 25 MG PO TABS
25.0000 mg | ORAL_TABLET | Freq: Two times a day (BID) | ORAL | Status: DC
  Administered 2024-03-07 – 2024-03-12 (×11): 25 mg via ORAL
  Filled 2024-03-06 (×13): qty 1

## 2024-03-06 MED ORDER — HYDROCODONE-ACETAMINOPHEN 5-325 MG PO TABS
2.0000 | ORAL_TABLET | ORAL | Status: DC | PRN
  Administered 2024-03-06 – 2024-03-08 (×4): 2 via ORAL
  Filled 2024-03-06 (×5): qty 2

## 2024-03-06 MED ORDER — HYDROMORPHONE HCL 1 MG/ML IJ SOLN: 0.2500 mg | INTRAMUSCULAR | Status: DC | PRN

## 2024-03-06 MED ORDER — OXYCODONE HCL 5 MG/5ML PO SOLN: 5.0000 mg | Freq: Once | ORAL | Status: DC | PRN

## 2024-03-06 MED ORDER — ADHERUS DURAL SEALANT
PACK | TOPICAL | Status: DC | PRN
  Administered 2024-03-06: 1 via TOPICAL

## 2024-03-06 MED ORDER — PANTOPRAZOLE SODIUM 40 MG PO TBEC
40.0000 mg | DELAYED_RELEASE_TABLET | Freq: Every day | ORAL | Status: DC
  Administered 2024-03-07 – 2024-03-12 (×6): 40 mg via ORAL
  Filled 2024-03-06 (×6): qty 1

## 2024-03-06 MED ORDER — ALUM & MAG HYDROXIDE-SIMETH 200-200-20 MG/5ML PO SUSP
30.0000 mL | Freq: Four times a day (QID) | ORAL | Status: DC | PRN
Start: 2024-03-06 — End: 2024-03-12

## 2024-03-06 MED ORDER — KETAMINE HCL 10 MG/ML IJ SOLN
INTRAMUSCULAR | Status: DC | PRN
  Administered 2024-03-06 (×2): 20 mg via INTRAVENOUS
  Administered 2024-03-06: 10 mg via INTRAVENOUS

## 2024-03-06 MED ORDER — BUPIVACAINE LIPOSOME 1.3 % IJ SUSP
INTRAMUSCULAR | Status: AC
  Filled 2024-03-06: qty 20

## 2024-03-06 MED ORDER — LEVOTHYROXINE SODIUM 88 MCG PO TABS
88.0000 ug | ORAL_TABLET | Freq: Every day | ORAL | Status: DC
  Administered 2024-03-07 – 2024-03-12 (×6): 88 ug via ORAL
  Filled 2024-03-06 (×6): qty 1

## 2024-03-06 MED ORDER — LIDOCAINE 2% (20 MG/ML) 5 ML SYRINGE
INTRAMUSCULAR | Status: DC | PRN
  Administered 2024-03-06: 80 mg via INTRAVENOUS

## 2024-03-06 MED ORDER — THROMBIN 20000 UNITS EX SOLR
CUTANEOUS | Status: DC | PRN
  Administered 2024-03-06: 20 mL via TOPICAL

## 2024-03-06 MED ORDER — SODIUM CHLORIDE 0.9% FLUSH: 3.0000 mL | INTRAVENOUS | Status: DC | PRN

## 2024-03-06 MED ORDER — ALPRAZOLAM 0.25 MG PO TABS
0.2500 mg | ORAL_TABLET | Freq: Every day | ORAL | Status: DC | PRN
Start: 2024-03-06 — End: 2024-03-12
  Administered 2024-03-08: 0.25 mg via ORAL
  Filled 2024-03-06: qty 1

## 2024-03-06 MED ORDER — ACETAMINOPHEN 10 MG/ML IV SOLN
INTRAVENOUS | Status: DC | PRN
  Administered 2024-03-06: 1000 mg via INTRAVENOUS

## 2024-03-06 MED ORDER — CELECOXIB 200 MG PO CAPS
200.0000 mg | ORAL_CAPSULE | Freq: Two times a day (BID) | ORAL | Status: DC
  Administered 2024-03-06 – 2024-03-12 (×12): 200 mg via ORAL
  Filled 2024-03-06 (×12): qty 1

## 2024-03-06 MED ORDER — CHLORHEXIDINE GLUCONATE CLOTH 2 % EX PADS: 6.0000 | MEDICATED_PAD | Freq: Once | CUTANEOUS | Status: DC

## 2024-03-06 MED ORDER — HYDROMORPHONE HCL 1 MG/ML IJ SOLN
INTRAMUSCULAR | Status: AC
  Filled 2024-03-06: qty 1

## 2024-03-06 MED ORDER — LACTATED RINGERS IV SOLN: INTRAVENOUS | Status: DC | PRN

## 2024-03-06 MED ORDER — ONDANSETRON HCL 4 MG/2ML IJ SOLN: 4.0000 mg | Freq: Once | INTRAMUSCULAR | Status: DC | PRN

## 2024-03-06 MED ORDER — THROMBIN 20000 UNITS EX SOLR
CUTANEOUS | Status: AC
  Filled 2024-03-06: qty 20000

## 2024-03-06 MED ORDER — SODIUM CHLORIDE 0.9 % IV SOLN
250.0000 mL | INTRAVENOUS | Status: AC
  Administered 2024-03-06: 250 mL via INTRAVENOUS

## 2024-03-06 MED ORDER — PHENOL 1.4 % MT LIQD: 1.0000 | OROMUCOSAL | Status: DC | PRN

## 2024-03-06 MED ORDER — FENTANYL CITRATE (PF) 250 MCG/5ML IJ SOLN
INTRAMUSCULAR | Status: DC | PRN
  Administered 2024-03-06 (×4): 50 ug via INTRAVENOUS

## 2024-03-06 MED ORDER — FOLIC ACID 1 MG PO TABS
1.0000 mg | ORAL_TABLET | Freq: Every day | ORAL | Status: DC
  Administered 2024-03-07 – 2024-03-12 (×6): 1 mg via ORAL
  Filled 2024-03-06 (×6): qty 1

## 2024-03-06 MED ORDER — NITROGLYCERIN 0.4 MG SL SUBL: 0.4000 mg | SUBLINGUAL_TABLET | SUBLINGUAL | Status: DC | PRN

## 2024-03-06 MED ORDER — METHOTREXATE SODIUM 2.5 MG PO TABS
10.0000 mg | ORAL_TABLET | ORAL | Status: DC
  Administered 2024-03-06: 10 mg via ORAL
  Filled 2024-03-06: qty 4

## 2024-03-06 MED ORDER — LIDOCAINE-EPINEPHRINE 1 %-1:100000 IJ SOLN
INTRAMUSCULAR | Status: DC | PRN
  Administered 2024-03-06: 10 mL

## 2024-03-06 MED ORDER — HYDROCHLOROTHIAZIDE 25 MG PO TABS
25.0000 mg | ORAL_TABLET | Freq: Every day | ORAL | Status: DC
  Administered 2024-03-07 – 2024-03-11 (×3): 25 mg via ORAL
  Filled 2024-03-06 (×5): qty 1

## 2024-03-06 MED ORDER — FENTANYL CITRATE (PF) 250 MCG/5ML IJ SOLN
INTRAMUSCULAR | Status: AC
  Filled 2024-03-06: qty 5

## 2024-03-06 MED ORDER — HYDROMORPHONE HCL 1 MG/ML IJ SOLN
0.2500 mg | INTRAMUSCULAR | Status: DC | PRN
  Administered 2024-03-06 (×2): 0.5 mg via INTRAVENOUS

## 2024-03-06 MED ORDER — PROPOFOL 10 MG/ML IV BOLUS
INTRAVENOUS | Status: DC | PRN
  Administered 2024-03-06: 120 mg via INTRAVENOUS

## 2024-03-06 MED ORDER — CYCLOBENZAPRINE HCL 10 MG PO TABS
10.0000 mg | ORAL_TABLET | Freq: Three times a day (TID) | ORAL | Status: DC | PRN
  Administered 2024-03-07 – 2024-03-10 (×5): 10 mg via ORAL
  Filled 2024-03-06 (×5): qty 1

## 2024-03-06 MED ORDER — RIMEGEPANT SULFATE 75 MG PO TBDP: 75.0000 mg | ORAL_TABLET | Freq: Every day | ORAL | Status: DC | PRN

## 2024-03-06 MED ORDER — ORAL CARE MOUTH RINSE: 15.0000 mL | Freq: Once | OROMUCOSAL | Status: AC

## 2024-03-06 MED ORDER — EZETIMIBE 10 MG PO TABS
10.0000 mg | ORAL_TABLET | Freq: Every day | ORAL | Status: DC
  Administered 2024-03-07 – 2024-03-12 (×6): 10 mg via ORAL
  Filled 2024-03-06 (×6): qty 1

## 2024-03-06 MED ORDER — SUGAMMADEX SODIUM 200 MG/2ML IV SOLN
INTRAVENOUS | Status: DC | PRN
  Administered 2024-03-06: 200 mg via INTRAVENOUS
  Administered 2024-03-06: 100 mg via INTRAVENOUS

## 2024-03-06 MED ORDER — LIDOCAINE-EPINEPHRINE 1 %-1:100000 IJ SOLN
INTRAMUSCULAR | Status: AC
  Filled 2024-03-06: qty 1

## 2024-03-06 MED ORDER — 0.9 % SODIUM CHLORIDE (POUR BTL) OPTIME
TOPICAL | Status: DC | PRN
  Administered 2024-03-06: 1000 mL

## 2024-03-06 MED ORDER — MUPIROCIN 2 % EX OINT: 1.0000 | TOPICAL_OINTMENT | Freq: Two times a day (BID) | CUTANEOUS | 0 refills | Status: AC

## 2024-03-06 MED ORDER — LACTATED RINGERS IV SOLN: INTRAVENOUS | Status: DC

## 2024-03-06 MED ORDER — DULOXETINE HCL 60 MG PO CPEP
60.0000 mg | ORAL_CAPSULE | Freq: Every day | ORAL | Status: DC
  Administered 2024-03-07 – 2024-03-12 (×6): 60 mg via ORAL
  Filled 2024-03-06 (×6): qty 1

## 2024-03-06 MED ORDER — PREGABALIN 100 MG PO CAPS
200.0000 mg | ORAL_CAPSULE | Freq: Two times a day (BID) | ORAL | Status: DC
  Administered 2024-03-06 – 2024-03-12 (×12): 200 mg via ORAL
  Filled 2024-03-06 (×12): qty 2

## 2024-03-06 MED ORDER — MAGNESIUM OXIDE -MG SUPPLEMENT 400 (240 MG) MG PO TABS
400.0000 mg | ORAL_TABLET | Freq: Every day | ORAL | Status: DC
  Administered 2024-03-07 – 2024-03-12 (×6): 400 mg via ORAL
  Filled 2024-03-06 (×6): qty 1

## 2024-03-06 MED ORDER — ONDANSETRON HCL 4 MG PO TABS: 4.0000 mg | ORAL_TABLET | Freq: Four times a day (QID) | ORAL | Status: DC | PRN

## 2024-03-06 MED ORDER — BUPIVACAINE HCL (PF) 0.25 % IJ SOLN
INTRAMUSCULAR | Status: AC
  Filled 2024-03-06: qty 30

## 2024-03-06 MED ORDER — CEFAZOLIN SODIUM-DEXTROSE 2-4 GM/100ML-% IV SOLN
2.0000 g | Freq: Three times a day (TID) | INTRAVENOUS | Status: AC
  Administered 2024-03-06 – 2024-03-07 (×2): 2 g via INTRAVENOUS
  Filled 2024-03-06 (×2): qty 100

## 2024-03-06 MED ORDER — ROCURONIUM BROMIDE 10 MG/ML (PF) SYRINGE
PREFILLED_SYRINGE | INTRAVENOUS | Status: DC | PRN
  Administered 2024-03-06: 10 mg via INTRAVENOUS
  Administered 2024-03-06: 20 mg via INTRAVENOUS
  Administered 2024-03-06: 60 mg via INTRAVENOUS
  Administered 2024-03-06: 20 mg via INTRAVENOUS

## 2024-03-06 MED ORDER — ASPIRIN 81 MG PO TBEC
81.0000 mg | DELAYED_RELEASE_TABLET | Freq: Every day | ORAL | Status: DC
  Administered 2024-03-07 – 2024-03-12 (×6): 81 mg via ORAL
  Filled 2024-03-06 (×6): qty 1

## 2024-03-06 MED ORDER — ACETAMINOPHEN 650 MG RE SUPP: 650.0000 mg | RECTAL | Status: DC | PRN

## 2024-03-06 SURGICAL SUPPLY — 65 items
BAG COUNTER SPONGE SURGICOUNT (BAG) ×1 IMPLANT
BASKET BONE COLLECTION (BASKET) ×1 IMPLANT
BENZOIN TINCTURE PRP APPL 2/3 (GAUZE/BANDAGES/DRESSINGS) ×1 IMPLANT
BLADE BONE MILL MEDIUM (MISCELLANEOUS) ×1 IMPLANT
BLADE CLIPPER SURG (BLADE) IMPLANT
BLADE SURG 11 STRL SS (BLADE) ×1 IMPLANT
BUR CUTTER 7.0 ROUND (BURR) ×1 IMPLANT
BUR MATCHSTICK NEURO 3.0 LAGG (BURR) ×1 IMPLANT
CANISTER SUCTION 3000ML PPV (SUCTIONS) ×1 IMPLANT
CAP LOCKING 5.5 CREO (Cap) IMPLANT
CNTNR URN SCR LID CUP LEK RST (MISCELLANEOUS) ×1 IMPLANT
COVER BACK TABLE 60X90IN (DRAPES) ×1 IMPLANT
DERMABOND ADVANCED .7 DNX12 (GAUZE/BANDAGES/DRESSINGS) ×1 IMPLANT
DRAPE C-ARM 42X72 X-RAY (DRAPES) ×2 IMPLANT
DRAPE C-ARMOR (DRAPES) IMPLANT
DRAPE HALF SHEET 40X57 (DRAPES) IMPLANT
DRAPE LAPAROTOMY 100X72X124 (DRAPES) ×1 IMPLANT
DRAPE SURG 17X23 STRL (DRAPES) ×1 IMPLANT
DRSG OPSITE 4X5.5 SM (GAUZE/BANDAGES/DRESSINGS) ×1 IMPLANT
DRSG OPSITE POSTOP 4X6 (GAUZE/BANDAGES/DRESSINGS) ×1 IMPLANT
DRSG OPSITE POSTOP 4X8 (GAUZE/BANDAGES/DRESSINGS) IMPLANT
DURAPREP 26ML APPLICATOR (WOUND CARE) ×1 IMPLANT
ELECTRODE REM PT RTRN 9FT ADLT (ELECTROSURGICAL) ×1 IMPLANT
EVACUATOR 1/8 PVC DRAIN (DRAIN) ×1 IMPLANT
GAUZE 4X4 16PLY ~~LOC~~+RFID DBL (SPONGE) IMPLANT
GAUZE SPONGE 4X4 12PLY STRL (GAUZE/BANDAGES/DRESSINGS) ×1 IMPLANT
GLOVE BIO SURGEON STRL SZ7 (GLOVE) IMPLANT
GLOVE BIO SURGEON STRL SZ8 (GLOVE) ×2 IMPLANT
GLOVE BIOGEL PI IND STRL 7.0 (GLOVE) IMPLANT
GLOVE INDICATOR 8.5 STRL (GLOVE) ×4 IMPLANT
GOWN STRL REUS W/ TWL LRG LVL3 (GOWN DISPOSABLE) IMPLANT
GOWN STRL REUS W/ TWL XL LVL3 (GOWN DISPOSABLE) ×2 IMPLANT
GOWN STRL REUS W/TWL 2XL LVL3 (GOWN DISPOSABLE) IMPLANT
GRAFT BNE MATRIX VG FRMBL MD 5 (Bone Implant) IMPLANT
GRAFT DURAGEN MATRIX 1WX1L (Tissue) IMPLANT
KIT BASIN OR (CUSTOM PROCEDURE TRAY) ×1 IMPLANT
KIT TURNOVER KIT B (KITS) ×1 IMPLANT
MILL BONE PREP (MISCELLANEOUS) ×1 IMPLANT
NDL HYPO 21X1.5 SAFETY (NEEDLE) ×1 IMPLANT
NDL HYPO 25X1 1.5 SAFETY (NEEDLE) ×1 IMPLANT
NEEDLE HYPO 21X1.5 SAFETY (NEEDLE) ×1 IMPLANT
NEEDLE HYPO 25X1 1.5 SAFETY (NEEDLE) ×1 IMPLANT
PACK LAMINECTOMY NEURO (CUSTOM PROCEDURE TRAY) ×1 IMPLANT
PAD ARMBOARD POSITIONER FOAM (MISCELLANEOUS) ×3 IMPLANT
PATTIES SURGICAL .5 X.5 (GAUZE/BANDAGES/DRESSINGS) IMPLANT
ROD CREO 100MM SPINAL (Rod) IMPLANT
SCREW MOD 6.0-5.0X35MM (Screw) IMPLANT
SCREW SET SOLERA TI5.5 (Screw) IMPLANT
SEALANT ADHERUS EXTEND TIP (MISCELLANEOUS) IMPLANT
SOLN 0.9% NACL POUR BTL 1000ML (IV SOLUTION) ×1 IMPLANT
SOLN STERILE WATER BTL 1000 ML (IV SOLUTION) ×1 IMPLANT
SPACER SUSTAIN TI 9X22X12 8D (Spacer) IMPLANT
SPIKE FLUID TRANSFER (MISCELLANEOUS) ×1 IMPLANT
SPONGE SURGIFOAM ABS GEL 100 (HEMOSTASIS) ×1 IMPLANT
SPONGE T-LAP 4X18 ~~LOC~~+RFID (SPONGE) IMPLANT
STRIP CLOSURE SKIN 1/2X4 (GAUZE/BANDAGES/DRESSINGS) ×2 IMPLANT
SUT PROLENE 6 0 BV (SUTURE) IMPLANT
SUT VIC AB 0 CT1 18XCR BRD8 (SUTURE) ×2 IMPLANT
SUT VIC AB 2-0 CT1 18 (SUTURE) ×1 IMPLANT
SUT VIC AB 4-0 PS2 27 (SUTURE) ×1 IMPLANT
SYR 20ML LL LF (SYRINGE) IMPLANT
TOWEL GREEN STERILE (TOWEL DISPOSABLE) ×1 IMPLANT
TOWEL GREEN STERILE FF (TOWEL DISPOSABLE) ×1 IMPLANT
TRAY FOLEY MTR SLVR 16FR STAT (SET/KITS/TRAYS/PACK) ×1 IMPLANT
TULIP CREP AMP 5.5MM (Orthopedic Implant) IMPLANT

## 2024-03-06 NOTE — Plan of Care (Signed)
  Problem: Education: Goal: Knowledge of General Education information will improve Description: Including pain rating scale, medication(s)/side effects and non-pharmacologic comfort measures Outcome: Progressing   Problem: Clinical Measurements: Goal: Ability to maintain clinical measurements within normal limits will improve Outcome: Progressing   Problem: Clinical Measurements: Goal: Ability to maintain clinical measurements within normal limits will improve Outcome: Progressing Goal: Will remain free from infection Outcome: Progressing Goal: Diagnostic test results will improve Outcome: Progressing Goal: Respiratory complications will improve Outcome: Progressing Goal: Cardiovascular complication will be avoided Outcome: Progressing

## 2024-03-06 NOTE — Transfer of Care (Signed)
 Immediate Anesthesia Transfer of Care Note  Patient: [facility]  Procedure(s) Performed: Posterior Lumbar Interbody Fusion - Lumbar three-Lumbar four with removal of hardware Lumbar four-Sacral one extension - Posterior Lateral and Interbody fusion (Back)  Patient Location: PACU  Anesthesia Type:General  Level of Consciousness: drowsy and responds to stimulation  Airway & Oxygen Therapy: Patient Spontanous Breathing and Patient connected to face mask oxygen  Post-op Assessment: Report given to RN and Post -op Vital signs reviewed and stable  Post vital signs: Reviewed and stable  Last Vitals:  Vitals Value Taken Time  BP 135/64 03/06/24 11:53  Temp    Pulse 105 03/06/24 11:54  Resp 10 03/06/24 11:54  SpO2 100 % 03/06/24 11:54  Vitals shown include unfiled device data.  Last Pain:  Vitals:   03/06/24 0630  TempSrc:   PainSc: 3       Patients Stated Pain Goal: 4 (03/06/24 0630)  Complications: No notable events documented.

## 2024-03-06 NOTE — Anesthesia Procedure Notes (Signed)
 Procedure Name: Intubation Date/Time: 03/06/2024 7:46 AM  Performed by: Arvell Edsel HERO, CRNAPre-anesthesia Checklist: Patient identified, Emergency Drugs available, Suction available, Patient being monitored and Timeout performed Patient Re-evaluated:Patient Re-evaluated prior to induction Oxygen Delivery Method: Circle system utilized Preoxygenation: Pre-oxygenation with 100% oxygen Induction Type: IV induction Ventilation: Mask ventilation without difficulty Laryngoscope Size: Glidescope and 3 Grade View: Grade I Tube type: Oral Tube size: 7.0 mm Number of attempts: 1 Airway Equipment and Method: Video-laryngoscopy and Patient positioned with wedge pillow Placement Confirmation: positive ETCO2, ETT inserted through vocal cords under direct vision and breath sounds checked- equal and bilateral Secured at: 22 cm Tube secured with: Tape Dental Injury: Teeth and Oropharynx as per pre-operative assessment

## 2024-03-06 NOTE — H&P (Signed)
 Cindy [name] is an 68 y.o. female.   Chief Complaint: Back and left greater than right leg pain HPI: 68 year old female with back and left greater than right leg pain previous history of L4-S1 fusion presents refractory to all forms of conservative treatment.  Workup revealed segmental degeneration above her fusion at L3-4.  And due to the patient's progression of clinical syndrome imaging findings of failed conservative treatment I recommended decompressive laminectomy interbody fusion L3-4 extension from L4-S1.  I have gone over the risks and benefits of the operation with her as well as perioperative course expectations of outcome and alternatives to surgery and she understands and agrees to proceed forward.  Past Medical History:  Diagnosis Date   Anemia    Anginal pain    hx -resolved   Anxiety    Arthritis    Bronchiectasis (HCC)    hx   CAD S/P percutaneous coronary angioplasty 2006   s/p BMS PCI in 2006 - Dx (3.0x85mm Driver BMS);  CATH 1/87: mDx ~ 20% ISR, EF 65%; NORMAL STUDY.  EF 60-65%.  No stress-induced arrhythmias or wall motion abnormalities.  Normal EKG.   Cataracts, bilateral    MD just watching   Depression    Dyslipidemia    Dysrhythmia    tachycardia if she misses Toprol    GERD (gastroesophageal reflux disease)    Headache 01/2024   in [place]   Hearing loss    no hearing aids   Hyperlipidemia    Hypertension    Hypothyroidism    Osteoarthritis    Shoulder and hip   Pneumonia    x several   Sleep apnea    occasional uses   pt just recvd new mask for CPAP and will be using new mask   Uterine cancer [facility])     Past Surgical History:  Procedure Laterality Date   ABDOMINAL HYSTERECTOMY     atrial valve surgery     05/2022   BACK SURGERY     [facility] 018, and 2021 (S1, and slipped disc)   COLONOSCOPY  2024   CORONARY STENT INTERVENTION  2006   BMS PCI of Diag: 3.0x60mm Driver BMS   LEFT HEART CATH AND CORONARY ANGIOGRAPHY  2006   -> Resulted in PCI of Diag    LEFT HEART CATH AND CORONARY ANGIOGRAPHY  11/2010    mDx stent ok with 20% ISR, EF 65%;    MRI     in [place] - x several   NO PAST SURGERIES     POSTERIOR LUMBAR FUSION  02/23/2015   L4   L5   STRESS ECHOCARDIOGRAM  07/2017   NORMAL STUDY.  EF 60-65%.  No stress-induced arrhythmias or wall motion abnormalities.  Normal EKG.   TONSILLECTOMY AND ADENOIDECTOMY     TOTAL ABDOMINAL HYSTERECTOMY W/ BILATERAL SALPINGOOPHORECTOMY  2008   UPPER GASTROINTESTINAL ENDOSCOPY  2024    Family History  Problem Relation Age of Onset   Heart attack Mother 57   Diabetes Mother    Rheum arthritis Mother    Glaucoma Sister    Cancer Sister    Lymphoma Sister    Melanoma Sister    Heart disease Maternal Grandmother    Colon cancer Neg Hx    Social History:  reports that she has never smoked. She has never used smokeless tobacco. She reports current alcohol use. She reports that she does not use drugs.  Allergies:  Allergies  Allergen Reactions   Amlodipine Swelling    Location  of swelling unknown   Morphine And Codeine Itching    Medications Prior to Admission  Medication Sig Dispense Refill   aspirin  EC 81 MG tablet Take 81 mg by mouth daily. Swallow whole.     celecoxib (CELEBREX) 200 MG capsule Take 200 mg by mouth 2 (two) times daily.     Cholecalciferol (D3-1000) 25 MCG (1000 UT) capsule Take 1,000 Units by mouth daily.     diclofenac  Sodium (VOLTAREN ) 1 % GEL Apply 2 g topically daily.     DULoxetine (CYMBALTA) 60 MG capsule Take 60 mg by mouth daily.     ezetimibe  (ZETIA ) 10 MG tablet Take 1 tablet (10 mg total) by mouth daily. 90 tablet 3   folic acid (FOLVITE) 1 MG tablet Take 1 mg by mouth daily.     hydrochlorothiazide (HYDRODIURIL) 25 MG tablet Take 25 mg by mouth daily.     inFLIXimab (REMICADE IV) Inject 1 Dose into the vein See admin instructions. Every 7 weeks     levothyroxine  (SYNTHROID ) 88 MCG tablet Take 88 mcg by mouth daily before breakfast.     losartan (COZAAR) 50 MG  tablet Take 50 mg by mouth daily.     magnesium  oxide (MAG-OX) 400 (240 Mg) MG tablet Take 400 mg by mouth daily.     methotrexate (RHEUMATREX) 2.5 MG tablet Take 10 mg by mouth once a week. Caution:Chemotherapy. Protect from light.     Multiple Vitamins-Minerals (PRESERVISION AREDS PO) Take 1 tablet by mouth in the morning and at bedtime.     nitroGLYCERIN  (NITROSTAT ) 0.4 MG SL tablet Place 0.4 mg under the tongue every 5 (five) minutes as needed for chest pain.     pantoprazole  (PROTONIX ) 40 MG tablet Take 40 mg by mouth daily.     pregabalin (LYRICA) 200 MG capsule Take 200 mg by mouth 2 (two) times daily.     Propylene Glycol (SYSTANE BALANCE) 0.6 % SOLN Place 2 drops into both eyes 2 (two) times daily as needed (dry eyes).     Rimegepant Sulfate (NURTEC) 75 MG TBDP Take 75 mg by mouth daily as needed (migraine).     rosuvastatin (CRESTOR) 40 MG tablet Take 40 mg by mouth daily.     sodium chloride  HYPERTONIC 3 % nebulizer solution Take 4 mLs by nebulization in the morning and at bedtime.     topiramate (TOPAMAX) 25 MG tablet Take 25 mg by mouth 2 (two) times daily.     VITAMIN E PO Take 1 capsule by mouth daily.     albuterol (VENTOLIN HFA) 108 (90 Base) MCG/ACT inhaler Inhale 2 puffs into the lungs every 6 (six) hours as needed for wheezing or shortness of breath.     ALPRAZolam (XANAX) 0.5 MG tablet Take 0.5 tablets by mouth daily as needed for anxiety.      No results found for this or any previous visit (from the past 48 hours). No results found.  Review of Systems  Musculoskeletal:  Positive for back pain.  Neurological:  Positive for numbness.    Blood pressure 122/67, pulse 66, temperature 98.1 F (36.7 C), temperature source Oral, resp. rate 18, height 5' 5 (1.651 m), weight 79.4 kg, SpO2 99%. Physical Exam HENT:     Head: Normocephalic.     Right Ear: Tympanic membrane normal.     Nose: Nose normal.  Cardiovascular:     Rate and Rhythm: Normal rate.     Pulses: Normal  pulses.  Pulmonary:     Effort:  Pulmonary effort is normal.  Musculoskeletal:        General: Normal range of motion.     Cervical back: Normal range of motion.  Skin:    General: Skin is warm.  Neurological:     Mental Status: She is alert.     Comments: Strength is 5 out of 5 iliopsoas, quads, hamstrings, gastrocs, ant tibialis, EHL slight intermittent weakness left foot      Assessment/Plan Patient presents for L3-4 decompression fusion with extension from L4-5  Arley SHAUNNA Helling, MD 03/06/2024, 7:14 AM

## 2024-03-06 NOTE — Op Note (Signed)
 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  Postoperative diagnosis: Same.  Procedure: #1 decompressive lumbar laminectomy L3-4 with radical foraminotomies at the L3 nerve root and redo foraminotomies of the L4 nerve root complete facetectomy removal of the pars in excess and requiring more work than would be needed with a standard interbody fusion.  2.  Posterior lumbar interbody fusion L3-4 utilizing the globus insert and rotate titanium cages packed with locally harvested autograft mixed with Vivigen.  3.  Cortical screw fixation with new screws placed at L3 tying into previous construct which had pre-existing globus cortical at L4 and L5 and [employer] Solera at S1.  Surgeon: Arley helling.  Assistant: Suzen Click.  Anesthesia: General.  EBL: 300.  Complications: Inadvertent dural tear.  HPI: 68 year old female progressive worsening back bilateral hip and leg pain worse on the left workup revealed severe segmental degeneration above her previous fusion at L4-S1 at L3-4.  Nomenclature taking into account rudimentay the space at S1-S2.  Due to patient's failed conservative treatment imaging findings and progression of clinical syndrome I recommend decompressive laminectomy interbody fusion at that level.  I extensively went over the risks and benefits of the operation with the patient as well as perioperative course expectations of outcome and alternatives to surgery and she understood and agreed to proceed forward.  Operative procedure: Patient brought into the OR was induced under general anesthesia her old incision was opened up and extended cephalad scar tissue was dissected free subperiosteal dissection was carried lamina on the facet joint at L3-4 exposing the previous fusion at L4-S1.  Then remove the spinous process of L3 perform central decompression and then perform remove the pars and performed radical foraminotomies of the L3 nerve roots  bilaterally.  On the patient's right side marching inferiorly as I was dissecting the dura off of the residual lamina at the interface with the previous fusion there was no competent dura underneath the lamina as I remove the lamina there was a significant dural tear on the lateral aspect the thecal sac medial to the exit of the L4 nerve root.  I then packed this away completed the facetectomy and decompression on the patient's left side and then worked from the left side back over to the right to try to identify where I could find native dura to perform a primary repair.  I was able to find the dural leaflet edge medially however there was no dural left under the lamina laterally so part of the dural tear that extended cephalad I was able to close primarily with 6-0 Prolene there was a defect inferiorly medial to the L4 pedicle that there was no dura to so so I packed this away with Gelfoam later would put DuraGen and green glue on top of it.  But after I arrested the CSF leak under bit the superior tickling complex gain access lateral margin to space that there was a large free fragment disc that was densely adherent to the lateral aspect of the dura on the left side this was teased off decompressing the lateral recess of the left L4 nerve root which is probably her most symptomatic nerve root.  Then the space was incised endplates prepared I then placed bilateral cages with an extensive mount of autograft mixed medially and at the end of discectomy there is no further stenosis on thecal sac or either L4 nerve root at the foramen.  After all the cages were inserted confirmed by fluoroscopy I placed 2 cortical  screws at L3 both screws had excellent purchase and imaging confirmed good position.  Then disconnected all the old hardware remove the rod there was Solera screws and Solera rod but the globus L4 and L5 screw Solera screws were in S1.  So have connected and top of top tightening knots from Solara that I used  for S1 I used the globus rod and anchored to new rods in place tightened everything down I then reexposed the dural defect that I was not able to primarily repair placed DuraGen along the lateral wall squirted green glue around the entire thecal sac decompression place more Gelfoam's and more green glue and then proceeded to close the wound primarily with interrupted Vicryl and the muscle and fascia as well as subcuticular and ran the skin with 4-0 subcuticular.  Wound was dressed Dermabond benzoin Steri-Strips and a sterile dressing and patient to cover him in stable condition.  At the end the case all needle counts and sponge counts were correct.

## 2024-03-06 NOTE — Anesthesia Postprocedure Evaluation (Signed)
 Anesthesia Post Note  Patient: [facility]  Procedure(s) Performed: Posterior Lumbar Interbody Fusion - Lumbar three-Lumbar four with removal of hardware Lumbar four-Sacral one extension - Posterior Lateral and Interbody fusion (Back)     Patient location during evaluation: PACU Anesthesia Type: General Level of consciousness: awake and alert Pain management: pain level controlled Vital Signs Assessment: post-procedure vital signs reviewed and stable Respiratory status: spontaneous breathing, nonlabored ventilation, respiratory function stable and patient connected to nasal cannula oxygen Cardiovascular status: blood pressure returned to baseline and stable Postop Assessment: no apparent nausea or vomiting Anesthetic complications: no   No notable events documented.  Last Vitals:  Vitals:   03/06/24 1530 03/06/24 1545  BP: (!) 97/59 (!) 102/56  Pulse: 82 82  Resp: 11 16  Temp:    SpO2: 99% 99%    Last Pain:  Vitals:   03/06/24 1545  TempSrc:   PainSc: Asleep    LLE Motor Response: Purposeful movement (03/06/24 1545) LLE Sensation: Full sensation (03/06/24 1545) RLE Motor Response: Purposeful movement (03/06/24 1545) RLE Sensation: Full sensation (03/06/24 1545)      Garnette DELENA Gab

## 2024-03-07 NOTE — Progress Notes (Signed)
 NEUROSURGERY PROGRESS NOTE  Doing well. Complains of appropriate back soreness. Legs feel good.  Does have some mild left sided headaches  Temp:  [96.8 F (36 C)-99 F (37.2 C)] 99 F (37.2 C) (11/15 0747) Pulse Rate:  [79-102] 80 (11/15 0747) Resp:  [7-18] 16 (11/15 0747) BP: (87-135)/(44-70) 103/49 (11/15 0747) SpO2:  [90 %-100 %] 100 % (11/15 0747)  Plan: Continue to lay flat. Ok to roll on side but HOB must flat at all times. Ok to have pillow or neck pillow  Suzen Chiquita Pean, NP 03/07/2024 10:23 AM

## 2024-03-07 NOTE — Plan of Care (Signed)

## 2024-03-08 MED ORDER — BISACODYL 5 MG PO TBEC
10.0000 mg | DELAYED_RELEASE_TABLET | Freq: Every day | ORAL | Status: DC
  Administered 2024-03-08 – 2024-03-12 (×5): 10 mg via ORAL
  Filled 2024-03-08 (×5): qty 2

## 2024-03-08 MED ORDER — FLEET ENEMA RE ENEM: 1.0000 | ENEMA | Freq: Every day | RECTAL | Status: DC | PRN

## 2024-03-08 MED ORDER — CHLORHEXIDINE GLUCONATE CLOTH 2 % EX PADS
6.0000 | MEDICATED_PAD | Freq: Every day | CUTANEOUS | Status: DC
  Administered 2024-03-08 – 2024-03-09 (×2): 6 via TOPICAL

## 2024-03-08 NOTE — Plan of Care (Signed)
  Problem: Education: Goal: Knowledge of General Education information will improve Description: Including pain rating scale, medication(s)/side effects and non-pharmacologic comfort measures Outcome: Progressing   Problem: Health Behavior/Discharge Planning: Goal: Ability to manage health-related needs will improve Outcome: Progressing   Problem: Clinical Measurements: Goal: Ability to maintain clinical measurements within normal limits will improve Outcome: Progressing Goal: Will remain free from infection Outcome: Progressing Goal: Diagnostic test results will improve Outcome: Progressing Goal: Respiratory complications will improve Outcome: Progressing Goal: Cardiovascular complication will be avoided Outcome: Progressing   Problem: Nutrition: Goal: Adequate nutrition will be maintained Outcome: Progressing   Problem: Activity: Goal: Risk for activity intolerance will decrease Outcome: Progressing   Problem: Coping: Goal: Level of anxiety will decrease Outcome: Progressing   Problem: Elimination: Goal: Will not experience complications related to bowel motility Outcome: Progressing Goal: Will not experience complications related to urinary retention Outcome: Progressing   Problem: Pain Managment: Goal: General experience of comfort will improve and/or be controlled Outcome: Progressing   Problem: Safety: Goal: Ability to remain free from injury will improve Outcome: Progressing   Problem: Skin Integrity: Goal: Risk for impaired skin integrity will decrease Outcome: Progressing   Problem: Education: Goal: Ability to verbalize activity precautions or restrictions will improve Outcome: Progressing Goal: Knowledge of the prescribed therapeutic regimen will improve Outcome: Progressing Goal: Understanding of discharge needs will improve Outcome: Progressing

## 2024-03-08 NOTE — Progress Notes (Signed)
 Patient ID: Cindy [name], female   DOB: 1955-10-31, 68 y.o.   MRN: [id number] Overall she is doing well.  Has appropriate back soreness.  No leg pain.  No numbness tingling or weakness.  No headache.  She continues to lie flat.  I set her up 6 degrees without headache.  Continue logroll for now.

## 2024-03-09 LAB — POCT I-STAT, CHEM 8
BUN: 17 mg/dL (ref 8–23)
Calcium, Ion: 1.24 mmol/L (ref 1.15–1.40)
Chloride: 99 mmol/L (ref 98–111)
Creatinine, Ser: 1 mg/dL (ref 0.44–1.00)
Glucose, Bld: 119 mg/dL — ABNORMAL HIGH (ref 70–99)
HCT: 27 % — ABNORMAL LOW (ref 36.0–46.0)
Hemoglobin: 9.2 g/dL — ABNORMAL LOW (ref 12.0–15.0)
Potassium: 2.9 mmol/L — ABNORMAL LOW (ref 3.5–5.1)
Sodium: 140 mmol/L (ref 135–145)
TCO2: 23 mmol/L (ref 22–32)

## 2024-03-09 NOTE — Progress Notes (Signed)
 Patient advanced to 30 degrees could not handle it so I brought her back down to 25 will stay here for about an hour and try to advance her per MD order.

## 2024-03-09 NOTE — Progress Notes (Signed)
 Patient bed elevated to 22 degrees attempted 30 not able to tolerate at this moment. Will stay here for an hour will attempt 30 degrees again.

## 2024-03-09 NOTE — Progress Notes (Signed)
 Patient was educated on plan to advance her to sitting up but refused. Stated she would rather wait until the morning so she could sleep tonight. Currently at 32 degrees.

## 2024-03-09 NOTE — Progress Notes (Signed)
 Subjective: Patient reports patient doing well no leg pain improved from preop  Objective: Vital signs in last 24 hours: Temp:  [97.5 F (36.4 C)-98.4 F (36.9 C)] 98 F (36.7 C) (11/17 0816) Pulse Rate:  [71-84] 71 (11/17 0816) Resp:  [16-18] 16 (11/17 0816) BP: (94-104)/(49-71) 94/53 (11/17 0816) SpO2:  [97 %-99 %] 99 % (11/17 0816)  Intake/Output from previous day: 11/16 0701 - 11/17 0700 In: 120 [P.O.:120] Out: 1725 [Urine:1725] Intake/Output this shift: Total I/O In: -  Out: 400 [Urine:400]  Strength 5 out of 5 incision clean dry and intact  Lab Results: No results for input(s): WBC, HGB, HCT, PLT in the last 72 hours. BMET No results for input(s): NA, K, CL, CO2, GLUCOSE, BUN, CREATININE, CALCIUM  in the last 72 hours.  Studies/Results: No results found.  Assessment/Plan: Postop day 3 decompressive lumbar laminectomy interbody fusion with intraoperative CSF leak.  Will start raising the head of her bed throughout the afternoon and potentially get out of bed this evening.  Physical therapy Occupational Therapy  LOS: 3 days     Cindy [name] 03/09/2024, 11:26 AM

## 2024-03-09 NOTE — Plan of Care (Signed)

## 2024-03-10 NOTE — Plan of Care (Signed)

## 2024-03-10 NOTE — Care Management Important Message (Signed)
 Important Message  Patient Details  Name: Cindy [name] MRN: [id number] Date of Birth: February 01, 1956   Important Message Given:  Yes - Medicare IM     [name] 03/10/2024, 1:18 PM

## 2024-03-10 NOTE — Evaluation (Signed)
 Occupational Therapy Evaluation Patient Details Name: Cindy [name] MRN: [id number] DOB: May 21, 1955 Today's Date: 03/10/2024   History of Present Illness   68 year old female with back and left greater than right leg pain previous history of L4-S1 fusion presents refractory to all forms of conservative treatment. 11/14 Lumbar laminectomy and fusion L3-4; inadvertent dural tear;  PMH arthritis, anxiety, CAD, dysrhythmia, hearing loss, HTN, uterine cancer     Clinical Impressions Patient admitted for the procedure above.  PTA she lives at home with her spouse, who is able to assist.  Patient states occasional Min A for lower body dress from spouse.  Patient with better tolerance with OT than PT session earlier.  Able to walk to the bathroom with RW, stand grooming, and walked about 4' in the halls with RW and supervision.  Min A for socks/LB dress sit to stand.  OT will continue efforts in the acute setting to address deficits, but no post acute OT is anticipated.  No dizziness and no headache with OT session.       If plan is discharge home, recommend the following:   Assist for transportation;A little help with bathing/dressing/bathroom;Assistance with cooking/housework     Functional Status Assessment   Patient has had a recent decline in their functional status and demonstrates the ability to make significant improvements in function in a reasonable and predictable amount of time.     Equipment Recommendations   None recommended by OT     Recommendations for Other Services         Precautions/Restrictions   Precautions Precautions: Back;Other (comment) Precaution Booklet Issued: Yes (comment) Recall of Precautions/Restrictions: Intact Required Braces or Orthoses: Spinal Brace Spinal Brace: Lumbar corset Restrictions Weight Bearing Restrictions Per Provider Order: No     Mobility Bed Mobility               General bed mobility comments: up in  recliner Patient Response: Cooperative  Transfers Overall transfer level: Needs assistance Equipment used: Rolling walker (2 wheels) Transfers: Sit to/from Stand, Bed to chair/wheelchair/BSC Sit to Stand: Supervision, Contact guard assist     Step pivot transfers: Supervision            Balance Overall balance assessment: Needs assistance Sitting-balance support: Feet supported Sitting balance-Leahy Scale: Good     Standing balance support: Reliant on assistive device for balance Standing balance-Leahy Scale: Fair Standing balance comment: able to stand and perform grooming task                           ADL either performed or assessed with clinical judgement   ADL       Grooming: Wash/dry hands;Supervision/safety;Standing               Lower Body Dressing: Minimal assistance;Sit to/from stand   Toilet Transfer: Retail Banker;Ambulation                   Vision Baseline Vision/History: 1 Wears glasses Patient Visual Report: No change from baseline       Perception Perception: Not tested       Praxis Praxis: Not tested       Pertinent Vitals/Pain Pain Assessment Pain Assessment: Faces Faces Pain Scale: Hurts a little bit Pain Location: back Pain Descriptors / Indicators: Operative site guarding Pain Intervention(s): Monitored during session     Extremity/Trunk Assessment Upper Extremity Assessment Upper Extremity Assessment: Overall WFL for tasks assessed   Lower Extremity Assessment Lower  Extremity Assessment: Defer to PT evaluation LLE Deficits / Details: numbness some areas from knee down--unchanged from prior to surgery   Cervical / Trunk Assessment Cervical / Trunk Assessment: Back Surgery   Communication Communication Communication: No apparent difficulties   Cognition Arousal: Alert Behavior During Therapy: WFL for tasks assessed/performed Cognition: No apparent impairments                                Following commands: Intact       Cueing  General Comments   Cueing Techniques: Verbal cues  Husband present   Exercises     Shoulder Instructions      Home Living Family/patient expects to be discharged to:: Private residence Living Arrangements: Spouse/significant other Available Help at Discharge: Family;Available 24 hours/day Type of Home: House Home Access: Stairs to enter [employer] of Steps: 3 Entrance Stairs-Rails: None Home Layout: One level     Bathroom Shower/Tub: Producer, Television/film/video: Standard Bathroom Accessibility: Yes   Home Equipment: Agricultural Consultant (2 wheels);Cane - single point;Shower seat;Grab bars - tub/shower;Grab bars - toilet;Adaptive equipment Adaptive Equipment: Reacher        Prior Functioning/Environment Prior Level of Function : Independent/Modified Independent             Mobility Comments: sometimes holding onto husband when out of house; walls inside ADLs Comments: uses grab bars in shower, assist as needed via spouse for LB ADL/dressing    OT Problem List: Impaired balance (sitting and/or standing);Decreased activity tolerance   OT Treatment/Interventions: Self-care/ADL training;Therapeutic activities;Balance training;DME and/or AE instruction;Patient/family education      OT Goals(Current goals can be found in the care plan section)   Acute Rehab OT Goals Patient Stated Goal: Return home OT Goal Formulation: With patient Time For Goal Achievement: 03/24/24 Potential to Achieve Goals: Good   OT Frequency:  Min 2X/week    Co-evaluation              AM-PAC OT 6 Clicks Daily Activity     Outcome Measure Help from another person eating meals?: None Help from another person taking care of personal grooming?: A Little Help from another person toileting, which includes using toliet, bedpan, or urinal?: A Little Help from another person bathing (including washing,  rinsing, drying)?: A Little Help from another person to put on and taking off regular upper body clothing?: None Help from another person to put on and taking off regular lower body clothing?: A Little 6 Click Score: 20   End of Session Equipment Utilized During Treatment: Rolling walker (2 wheels);Back brace Nurse Communication: Mobility status  Activity Tolerance: Patient tolerated treatment well Patient left: in chair;with call bell/phone within reach;with family/visitor present  OT Visit Diagnosis: Unsteadiness on feet (R26.81)                Time: 8887-8865 OT Time Calculation (min): 22 min Charges:  OT General Charges $OT Visit: 1 Visit OT Evaluation $OT Eval Moderate Complexity: 1 Mod  03/10/2024  RP, OTR/L  Acute Rehabilitation Services  Office:  [phone]   Cindy [name] 03/10/2024, 11:39 AM

## 2024-03-10 NOTE — Evaluation (Signed)
 Physical Therapy Evaluation Patient Details Name: Cindy [name] MRN: [id number] DOB: 10/21/1955 Today's Date: 03/10/2024  History of Present Illness  68 year old female with back and left greater than right leg pain previous history of L4-S1 fusion presents refractory to all forms of conservative treatment. 11/14 Lumbar laminectomy and fusion L3-4; inadvertent dural tear;  PMH arthritis, anxiety, CAD, dysrhythmia, hearing loss, HTN, uterine cancer  Clinical Impression   Pt admitted secondary to problem above with deficits below. PTA patient was living with spouse in one-level home with 3 steps to enter without rail. She was unsteady on her feet and used walls inside and holding onto her husband outside home. Pt currently limited by ++orthostasis although asymptomatic. Returned to sitting with BP partially recovering and stood again with lesser drop and able to step-pivot to the chair. Patient will need to tolerate ambulation with min assist and up/down 3 steps without a rail with min assist prior to discharge.  Anticipate patient will benefit from PT to address problems listed below. Will continue to follow acutely to maximize functional mobility, independence, and safety.  Anticipate no followup PT or DME needs on discharge.          If plan is discharge home, recommend the following: A little help with walking and/or transfers;A little help with bathing/dressing/bathroom;Assistance with cooking/housework;Assist for transportation;Help with stairs or ramp for entrance   Can travel by private vehicle        Equipment Recommendations None recommended by PT  Recommendations for Other Services  OT consult    Functional Status Assessment Patient has had a recent decline in their functional status and demonstrates the ability to make significant improvements in function in a reasonable and predictable amount of time.     Precautions / Restrictions Precautions Precautions: Back;Other  (comment) Precaution Booklet Issued: Yes (comment) Recall of Precautions/Restrictions: Impaired Precaution/Restrictions Comments: +orthostasis--asymptomatic; able to state 3/3 precautions; cues to adhere Required Braces or Orthoses: Spinal Brace Spinal Brace: Lumbar corset;Applied in sitting position      Mobility  Bed Mobility Overal bed mobility: Needs Assistance Bed Mobility: Rolling, Sidelying to Sit Rolling: Contact guard assist, Used rails Sidelying to sit: Contact guard assist, HOB elevated, Used rails       General bed mobility comments: pt had been slowly elevating HOB this a.m. and reached 35 degrees; remained there as rolled and side to sit due to anticipated orthostasis    Transfers Overall transfer level: Needs assistance Equipment used: Rolling walker (2 wheels) Transfers: Sit to/from Stand, Bed to chair/wheelchair/BSC Sit to Stand: Min assist, From elevated surface   Step pivot transfers: Min assist, From elevated surface       General transfer comment: pt unable to stand from bed at lowest height with both hands on bed; elevated to simulate ht of home bed and used L hand on RW with PT stabilizing RW with sit to stand; returned to sitting due to drop in BP and required repeat instruction on 2nd sit to stand    Ambulation/Gait               General Gait Details: deferred due to drop in BP  Stairs            Wheelchair Mobility     Tilt Bed    Modified Rankin (Stroke Patients Only)       Balance Overall balance assessment: Mild deficits observed, not formally tested  Pertinent Vitals/Pain Pain Assessment Pain Assessment: 0-10 Pain Score: 5  Pain Location: back Pain Descriptors / Indicators: Operative site guarding Pain Intervention(s): Limited activity within patient's tolerance, Monitored during session, Repositioned    Home Living Family/patient expects to be discharged  to:: Private residence Living Arrangements: Spouse/significant other Available Help at Discharge: Family;Available 24 hours/day Type of Home: House Home Access: Stairs to enter Entrance Stairs-Rails: None Entrance Stairs-Number of Steps: 3   Home Layout: One level Home Equipment: Agricultural Consultant (2 wheels);Cane - single point;Shower seat;Grab bars - tub/shower;Grab bars - toilet      Prior Function Prior Level of Function : Independent/Modified Independent             Mobility Comments: sometimes holding onto husband when out of house; walls inside ADLs Comments: uses grab bars in shower     Extremity/Trunk Assessment   Upper Extremity Assessment Upper Extremity Assessment: Overall WFL for tasks assessed    Lower Extremity Assessment Lower Extremity Assessment: LLE deficits/detail LLE Deficits / Details: numbness some areas from knee down--unchanged from prior to surgery    Cervical / Trunk Assessment Cervical / Trunk Assessment: Back Surgery  Communication   Communication Communication: No apparent difficulties    Cognition Arousal: Alert Behavior During Therapy: WFL for tasks assessed/performed   PT - Cognitive impairments: No apparent impairments                         Following commands: Intact       Cueing Cueing Techniques: Verbal cues     General Comments General comments (skin integrity, edema, etc.): Husband present    Exercises     Assessment/Plan    PT Assessment Patient needs continued PT services  PT Problem List Decreased activity tolerance;Decreased balance;Decreased mobility;Decreased knowledge of use of DME;Decreased knowledge of precautions;Cardiopulmonary status limiting activity;Impaired sensation;Obesity;Pain       PT Treatment Interventions DME instruction;Stair training;Gait training;Functional mobility training;Therapeutic activities;Patient/family education    PT Goals (Current goals can be found in the Care Plan  section)  Acute Rehab PT Goals Patient Stated Goal: return home when appropriate PT Goal Formulation: With patient/family Time For Goal Achievement: 03/24/24 Potential to Achieve Goals: Good    Frequency Min 5X/week     Co-evaluation               AM-PAC PT 6 Clicks Mobility  Outcome Measure Help needed turning from your back to your side while in a flat bed without using bedrails?: A Little Help needed moving from lying on your back to sitting on the side of a flat bed without using bedrails?: A Little Help needed moving to and from a bed to a chair (including a wheelchair)?: A Little Help needed standing up from a chair using your arms (e.g., wheelchair or bedside chair)?: A Little Help needed to walk in hospital room?: Total Help needed climbing 3-5 steps with a railing? : Total 6 Click Score: 14    End of Session Equipment Utilized During Treatment: Gait belt;Back brace Activity Tolerance: Treatment limited secondary to medical complications (Comment) (asymptomatic orthostasis) Patient left: in chair;with call bell/phone within reach;with chair alarm set;with family/visitor present Nurse Communication: Mobility status;Other (comment) (orthostasis; has a brace) PT Visit Diagnosis: Other abnormalities of gait and mobility (R26.89);Pain Pain - part of body:  (back)    Time: 9070-8993 PT Time Calculation (min) (ACUTE ONLY): 37 min   Charges:   PT Evaluation $PT Eval Low Complexity: 1 Low PT  Treatments $Gait Training: 8-22 mins PT General Charges $$ ACUTE PT VISIT: 1 Visit          Macario RAMAN, PT Acute Rehabilitation Services  Office [phone]   Macario SHAUNNA Soja 03/10/2024, 10:28 AM

## 2024-03-10 NOTE — Progress Notes (Signed)
 Subjective: Patient reports patient with pain manageable  Objective: Vital signs in last 24 hours: Temp:  [97.6 F (36.4 C)-98 F (36.7 C)] 97.6 F (36.4 C) (11/18 0447) Pulse Rate:  [71-84] 72 (11/18 0447) Resp:  [16] 16 (11/18 0447) BP: (94-114)/(49-64) 114/64 (11/18 0447) SpO2:  [97 %-100 %] 97 % (11/18 0447)  Intake/Output from previous day: 11/17 0701 - 11/18 0700 In: 120 [P.O.:120] Out: 1450 [Urine:1450] Intake/Output this shift: No intake/output data recorded.  Strength incision clean dry intact  Lab Results: No results for input(s): WBC, HGB, HCT, PLT in the last 72 hours. BMET No results for input(s): NA, K, CL, CO2, GLUCOSE, BUN, CREATININE, CALCIUM  in the last 72 hours.  Studies/Results: No results found.  Assessment/Plan: Postop for decompressive laminectomy and fusion doing very well will mobilize today physical therapy take out her foley  LOS: 4 days     Cindy [name] 03/10/2024, 7:43 AM

## 2024-03-10 NOTE — Plan of Care (Signed)

## 2024-03-11 LAB — CBC
HCT: 24 % — ABNORMAL LOW (ref 36.0–46.0)
Hemoglobin: 8.1 g/dL — ABNORMAL LOW (ref 12.0–15.0)
MCH: 30.7 pg (ref 26.0–34.0)
MCHC: 33.8 g/dL (ref 30.0–36.0)
MCV: 90.9 fL (ref 80.0–100.0)
Platelets: 186 K/uL (ref 150–400)
RBC: 2.64 MIL/uL — ABNORMAL LOW (ref 3.87–5.11)
RDW: 13 % (ref 11.5–15.5)
WBC: 7.9 K/uL (ref 4.0–10.5)
nRBC: 0 % (ref 0.0–0.2)

## 2024-03-11 LAB — BASIC METABOLIC PANEL WITH GFR
Anion gap: 11 (ref 5–15)
BUN: 18 mg/dL (ref 8–23)
CO2: 24 mmol/L (ref 22–32)
Calcium: 8.4 mg/dL — ABNORMAL LOW (ref 8.9–10.3)
Chloride: 105 mmol/L (ref 98–111)
Creatinine, Ser: 1.11 mg/dL — ABNORMAL HIGH (ref 0.44–1.00)
GFR, Estimated: 54 mL/min — ABNORMAL LOW (ref >60)
Glucose, Bld: 108 mg/dL — ABNORMAL HIGH (ref 70–99)
Potassium: 3.7 mmol/L (ref 3.5–5.1)
Sodium: 140 mmol/L (ref 135–145)

## 2024-03-11 MED ORDER — CYCLOBENZAPRINE HCL 10 MG PO TABS: 10.0000 mg | ORAL_TABLET | Freq: Three times a day (TID) | ORAL | 0 refills | Status: AC | PRN

## 2024-03-11 MED ORDER — SODIUM CHLORIDE 0.9 % IV BOLUS
500.0000 mL | Freq: Once | INTRAVENOUS | Status: AC
  Administered 2024-03-11: 500 mL via INTRAVENOUS

## 2024-03-11 MED ORDER — HYDROCODONE-ACETAMINOPHEN 5-325 MG PO TABS: 2.0000 | ORAL_TABLET | ORAL | 0 refills | Status: AC | PRN

## 2024-03-11 NOTE — Discharge Summary (Addendum)
 Physician Discharge Summary  Patient ID: Cindy [name] MRN: [id number] DOB/AGE: Feb 22, 1956 68 y.o. Estimated body mass index is 29.12 kg/m as calculated from the following:   Height as of this encounter: 5' 5 (1.651 m).   Weight as of this encounter: 79.4 kg.   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  Discharge Diagnoses: Same Principal Problem:   Spinal stenosis of lumbar region   Discharged Condition: good  Hospital Course: Patient was admitted to hospital underwent decompressive laminectomy interbody fusion L3-4 patient had intraoperative CSF leak so patient was kept flat for the first 3 days postoperatively then mobilized and she mobilized well was ambulating and voiding spontaneously did have a little bit of episodic orthostasis on the morning of possible discharge so we got Occupational Therapy to evaluate we gave the patient a fluid bolus will observe throughout the day and as long as blood pressure remained stable when standing up and ambulating patient can be discharged with scheduled follow-up in 1 to 2 weeks.  Consults: Significant Diagnostic Studies: Treatments: Decompression stabilization L3-4 Discharge Exam: Blood pressure 123/74, pulse 77, temperature 98 F (36.7 C), resp. rate 16, height 5' 5 (1.651 m), weight 79.4 kg, SpO2 99%. Strength 5 out of 5 wound clean dry and intact  Disposition: Home  Discharge Instructions      Remove dressing in 72 hours   Complete by: As directed    Call MD for:   Complete by: As directed    Call MD for:  difficulty breathing, headache or visual disturbances   Complete by: As directed    Call MD for:  hives   Complete by: As directed    Call MD for:  persistant dizziness or light-headedness   Complete by: As directed    Call MD for:  persistant nausea and vomiting   Complete by: As directed    Call MD for:  redness,  tenderness, or signs of infection (pain, swelling, redness, odor or green/yellow discharge around incision site)   Complete by: As directed    Call MD for:  severe uncontrolled pain   Complete by: As directed    Call MD for:  temperature >100.4   Complete by: As directed    Diet - low sodium heart healthy   Complete by: As directed    Driving Restrictions   Complete by: As directed    No driving for 2 weeks, no riding in the car for 1 week   Increase activity slowly   Complete by: As directed    Lifting restrictions   Complete by: As directed    No lifting more than 8 lbs      Allergies as of 03/12/2024       Reactions   Amlodipine Swelling   Location of swelling unknown   Morphine And Codeine Itching        Medication List     STOP taking these medications    diclofenac  Sodium 1 % Gel Commonly known as: VOLTAREN        TAKE these medications    albuterol 108 (90 Base) MCG/ACT inhaler Commonly known as: VENTOLIN HFA Inhale 2 puffs into the lungs every 6 (six) hours as needed for wheezing or shortness of breath.   ALPRAZolam 0.5 MG tablet Commonly known as: XANAX Take 0.5 tablets by mouth daily as needed for anxiety.   aspirin  EC 81 MG tablet Take 81 mg by mouth daily. Swallow whole.  celecoxib 200 MG capsule Commonly known as: CELEBREX Take 200 mg by mouth 2 (two) times daily.   chlorhexidine  4 % external liquid Commonly known as: HIBICLENS Apply 15 mLs (1 Application total) topically as directed for 30 doses. Use as directed daily for 5 days every other week for 6 weeks.   cyclobenzaprine  10 MG tablet Commonly known as: FLEXERIL  Take 1 tablet (10 mg total) by mouth 3 (three) times daily as needed for muscle spasms.   D3-1000 25 MCG (1000 UT) capsule Generic drug: Cholecalciferol Take 1,000 Units by mouth daily.   DULoxetine 60 MG capsule Commonly known as: CYMBALTA Take 60 mg by mouth daily.   ezetimibe  10 MG tablet Commonly known as:  ZETIA  Take 1 tablet (10 mg total) by mouth daily.   folic acid 1 MG tablet Commonly known as: FOLVITE Take 1 mg by mouth daily.   hydrochlorothiazide 25 MG tablet Commonly known as: HYDRODIURIL Take 25 mg by mouth daily.   HYDROcodone-acetaminophen  5-325 MG tablet Commonly known as: NORCO/VICODIN Take 2 tablets by mouth every 4 (four) hours as needed for severe pain (pain score 7-10).   levothyroxine  88 MCG tablet Commonly known as: SYNTHROID  Take 88 mcg by mouth daily before breakfast.   losartan 50 MG tablet Commonly known as: COZAAR Take 50 mg by mouth daily.   magnesium  oxide 400 (240 Mg) MG tablet Commonly known as: MAG-OX Take 400 mg by mouth daily.   methotrexate 2.5 MG tablet Commonly known as: RHEUMATREX Take 10 mg by mouth once a week. Caution:Chemotherapy. Protect from light.   mupirocin ointment 2 % Commonly known as: BACTROBAN Place 1 Application into the nose 2 (two) times daily for 60 doses. Use as directed 2 times daily for 5 days every other week for 6 weeks.   nitroGLYCERIN  0.4 MG SL tablet Commonly known as: NITROSTAT  Place 0.4 mg under the tongue every 5 (five) minutes as needed for chest pain.   Nurtec 75 MG Tbdp Generic drug: Rimegepant Sulfate Take 75 mg by mouth daily as needed (migraine).   pantoprazole  40 MG tablet Commonly known as: PROTONIX  Take 40 mg by mouth daily.   pregabalin 200 MG capsule Commonly known as: LYRICA Take 200 mg by mouth 2 (two) times daily.   PRESERVISION AREDS PO Take 1 tablet by mouth in the morning and at bedtime.   REMICADE IV Inject 1 Dose into the vein See admin instructions. Every 7 weeks   rosuvastatin 40 MG tablet Commonly known as: CRESTOR Take 40 mg by mouth daily.   sodium chloride  HYPERTONIC 3 % nebulizer solution Take 4 mLs by nebulization in the morning and at bedtime.   Systane Balance 0.6 % Soln Generic drug: Propylene Glycol Place 2 drops into both eyes 2 (two) times daily as needed  (dry eyes).   topiramate 25 MG tablet Commonly known as: TOPAMAX Take 25 mg by mouth 2 (two) times daily.   VITAMIN E PO Take 1 capsule by mouth daily.        Follow-up Information     Lazoff, Shawn P, DO Follow up.   Specialty: Family Medicine Contact information: [address] [place] [address] [phone]                 Signed: Suzen Chiquita Pean 03/12/2024, 2:00 PM

## 2024-03-11 NOTE — Progress Notes (Addendum)
 Physical Therapy Treatment Patient Details Name: Cindy [name] MRN: [id number] DOB: [birth year] Today's Date: 03/11/2024   History of Present Illness 68 year old female with back and left greater than right leg pain previous history of L4-S1 fusion presents refractory to all forms of conservative treatment. 11/14 Lumbar laminectomy and fusion L3-4; inadvertent dural tear;  PMH arthritis, anxiety, CAD, dysrhythmia, hearing loss, HTN, uterine cancer    PT Comments  The pt is making good functional progress as she was able to ambulate up to ~120 ft and navigate up and down x8 stairs with either HHA, RW support, or RW + HHA support. Educated pt on various ways to use the RW on stairs or just navigate stairs with HHA alone (handout provided for navigating stairs with RW as well). She needed CGA and intermittent minA for balance when ascending but minA more consistently for balance when descending due to noted poor eccentric control. Her BP did drop when standing, but she remains asymptomatic and her BP improved some with standing mobility, see below. Notified RN. Will continue to follow acutely. Educated pt on car transfers and recs to ambulate >/= x3 longer walks/day to tolerance upon d/c. She verbalized understanding.   BP- 106/60 supine 108/72 sitting 85/60 standing 99/61 standing after ambulating     If plan is discharge home, recommend the following: A little help with walking and/or transfers;A little help with bathing/dressing/bathroom;Assistance with cooking/housework;Assist for transportation;Help with stairs or ramp for entrance   Can travel by private vehicle        Equipment Recommendations  None recommended by PT    Recommendations for Other Services OT consult     Precautions / Restrictions Precautions Precautions: Back;Other (comment);Fall Precaution Booklet Issued: Yes (comment) Recall of Precautions/Restrictions: Impaired Precaution/Restrictions Comments:  +orthostasis--asymptomatic; able to state 3/3 precautions; cues to adhere Required Braces or Orthoses: Spinal Brace Spinal Brace: Lumbar corset;Applied in sitting position Restrictions Weight Bearing Restrictions Per Provider Order: No     Mobility  Bed Mobility Overal bed mobility: Needs Assistance Bed Mobility: Rolling, Sidelying to Sit Rolling: Supervision, Used rails Sidelying to sit: HOB elevated, Used rails, Supervision       General bed mobility comments: Supervision for safety, pt complies to her spinal precautions well throughout    Transfers Overall transfer level: Needs assistance Equipment used: Rolling walker (2 wheels) Transfers: Sit to/from Stand Sit to Stand: Contact guard assist           General transfer comment: Slow to power up to stand from low surfaces, but able to stand 1x from low EOB and 1x from commode without LOB, CGA for safety and cues for hand placement to push up from bed or pull up on wall rail rather than pull up on RW    Ambulation/Gait Ambulation/Gait assistance: Contact guard assist Gait Distance (Feet): 120 Feet (x2 bouts of ~120 ft > ~15 ft) Assistive device: Rolling walker (2 wheels) Gait Pattern/deviations: Step-through pattern, Decreased stride length Gait velocity: reduced Gait velocity interpretation: <1.8 ft/sec, indicate of risk for recurrent falls   General Gait Details: Pt takes slow, small, but fairly steady steps with a RW. No LOB, CGA for safety   Stairs Stairs: Yes Stairs assistance: Contact guard assist, Min assist Stair Management: No rails, Step to pattern, Forwards, Backwards, With walker Number of Stairs: 8 General stair comments: Educated pt on ascending and descending stairs forward with use of RW sideways placed on stairs for x1 UE support and x1 HHA on the other, x3 stairs in this  manner, CGA-minA ascending and minA descending. Educated pt on ascending backward and descending forwards with RW facing forward on  stairs for x2 UE support on RW, minA to hold RW in place, x3 reps. Educated pt on x1 HHA without AD ascending and descending forward on x2 stairs with CGA ascending and minA descending.   Wheelchair Mobility     Tilt Bed    Modified Rankin (Stroke Patients Only)       Balance Overall balance assessment: Mild deficits observed, not formally tested, Needs assistance Sitting-balance support: No upper extremity supported, Feet supported Sitting balance-Leahy Scale: Good     Standing balance support: Bilateral upper extremity supported, Reliant on assistive device for balance, During functional activity Standing balance-Leahy Scale: Poor Standing balance comment: reliant on UE support                            Communication Communication Communication: No apparent difficulties  Cognition Arousal: Alert Behavior During Therapy: WFL for tasks assessed/performed   PT - Cognitive impairments: No apparent impairments                         Following commands: Intact      Cueing Cueing Techniques: Verbal cues  Exercises      General Comments General comments (skin integrity, edema, etc.): Educated pt on car transfers and recs to ambulate >/= x3 longer walks/day to tolerance upon d/c. She verbalized understanding. BP 106/60 supine, 108/72 sitting, 85/60 standing, 99/61 standing after ambulating      Pertinent Vitals/Pain Pain Assessment Pain Assessment: Faces Faces Pain Scale: Hurts little more Pain Location: back Pain Descriptors / Indicators: Operative site guarding Pain Intervention(s): Limited activity within patient's tolerance, Monitored during session, Repositioned    Home Living                          Prior Function            PT Goals (current goals can now be found in the care plan section) Acute Rehab PT Goals Patient Stated Goal: return home when appropriate PT Goal Formulation: With patient Time For Goal Achievement:  03/24/24 Potential to Achieve Goals: Good Progress towards PT goals: Progressing toward goals    Frequency    Min 5X/week      PT Plan      Co-evaluation              AM-PAC PT 6 Clicks Mobility   Outcome Measure  Help needed turning from your back to your side while in a flat bed without using bedrails?: A Little Help needed moving from lying on your back to sitting on the side of a flat bed without using bedrails?: A Little Help needed moving to and from a bed to a chair (including a wheelchair)?: A Little Help needed standing up from a chair using your arms (e.g., wheelchair or bedside chair)?: A Little Help needed to walk in hospital room?: A Little Help needed climbing 3-5 steps with a railing? : A Little 6 Click Score: 18    End of Session Equipment Utilized During Treatment: Back brace Activity Tolerance: Patient tolerated treatment well Patient left: in chair;with call bell/phone within reach;with chair alarm set Nurse Communication: Mobility status;Other (comment) (BP) PT Visit Diagnosis: Other abnormalities of gait and mobility (R26.89);Pain;Unsteadiness on feet (R26.81);Difficulty in walking, not elsewhere classified (R26.2) Pain - part of  body:  (back)     Time: 9064-8995 PT Time Calculation (min) (ACUTE ONLY): 29 min  Charges:    $Gait Training: 23-37 mins PT General Charges $$ ACUTE PT VISIT: 1 Visit                     Cindy [name], PT, DPT Acute Rehabilitation Services  Office: [phone]    Cindy [name] 03/11/2024, 10:16 AM

## 2024-03-11 NOTE — Progress Notes (Signed)
 Mobility Specialist Progress Note:   03/11/24 1431  Orthostatic Lying   BP- Lying 113/60  Pulse- Lying 80  Orthostatic Sitting  BP- Sitting 97/50  Pulse- Sitting 60  Orthostatic Standing at 0 minutes  BP- Standing at 0 minutes (!) 85/44  Pulse- Standing at 0 minutes 82  Orthostatic Standing at 3 minutes  BP- Standing at 3 minutes (!) 89/52  Pulse- Standing at 3 minutes 83  Mobility  Activity Stood at bedside;Ambulated with assistance  Level of Assistance Minimal assist, patient does 75% or more  Assistive Device Front wheel walker  Distance Ambulated (ft) 10 ft  Activity Response Tolerated well  Mobility Referral Yes  Mobility visit 1 Mobility  Mobility Specialist Start Time (ACUTE ONLY) 1415  Mobility Specialist Stop Time (ACUTE ONLY) 1430  Mobility Specialist Time Calculation (min) (ACUTE ONLY) 15 min   RN requested orthostatic VS on pt, pt agreeable to mobility. See orthostatic vitals above. No c/o throughout, no signs of dizziness. Pt required MinA for STS and contact guard for ambulation.post orthostatic VS pt required to use the BR. After BR took BP again, 104/62 HR 89. Returned to supine in bed w/ call bell and personal belongings in reach. All needs met. RN aware.   Thersia Minder Mobility Specialist  Please contact vis Secure Chat or  Rehab Office [phone]

## 2024-03-11 NOTE — Plan of Care (Signed)
  Problem: Clinical Measurements: Goal: Will remain free from infection Outcome: Progressing   Problem: Activity: Goal: Risk for activity intolerance will decrease Outcome: Progressing   Problem: Coping: Goal: Level of anxiety will decrease Outcome: Progressing   Problem: Pain Management: Goal: Pain level will decrease Outcome: Progressing   Problem: Education: Goal: Knowledge of General Education information will improve Description: Including pain rating scale, medication(s)/side effects and non-pharmacologic comfort measures Outcome: Progressing

## 2024-03-12 NOTE — Progress Notes (Signed)
 Physical Therapy Treatment Patient Details Name: Cindy [name] MRN: [id number] DOB: Jan 03, 1956 Today's Date: 03/12/2024   History of Present Illness 68 year old female with back and left greater than right leg pain previous history of L4-S1 fusion presents refractory to all forms of conservative treatment. 11/14 Lumbar laminectomy and fusion L3-4; inadvertent dural tear;  PMH arthritis, anxiety, CAD, dysrhythmia, hearing loss, HTN, uterine cancer    PT Comments  Pt resting in bed on arrival, pleasant and agreeable to session with good progress towards acute goals. Pt demonstrating bed mobility, transfers and gait with RW for support with grossly CGA-supervision for safety. Pt with good recall for education on stair negotiation techniques with pt able to demonstrating forwards and backwards technique with light min A to maintain balance and manage RW. Pt BP continues to drop with positional changes, however pt remains asymtommatic and improved with cotnineud standing mobility, see below. RN updated. Continued education on  walker use to maximize functional independence, safety, and decrease risk for falls, as well as safe car entry/exit, appropriate activity progression and importance of continued mobility. Pt continues to benefit from skilled PT services to progress toward functional mobility goals.    BP readings: 114/72 (85) supine 96/67 (77) sitting up EOB 86/68 (75) standing 109/68 (79) post ambulation    If plan is discharge home, recommend the following: A little help with walking and/or transfers;A little help with bathing/dressing/bathroom;Assistance with cooking/housework;Assist for transportation;Help with stairs or ramp for entrance   Can travel by private vehicle        Equipment Recommendations  None recommended by PT    Recommendations for Other Services       Precautions / Restrictions Precautions Precautions: Back;Other (comment);Fall Precaution Booklet Issued: Yes  (comment) Recall of Precautions/Restrictions: Impaired Precaution/Restrictions Comments: +orthostasis--asymptomatic; able to state 3/3 precautions; cues to adhere Required Braces or Orthoses: Spinal Brace Spinal Brace: Lumbar corset;Applied in sitting position Restrictions Weight Bearing Restrictions Per Provider Order: No     Mobility  Bed Mobility Overal bed mobility: Needs Assistance Bed Mobility: Rolling, Sidelying to Sit Rolling: Supervision, Used rails Sidelying to sit: HOB elevated, Used rails, Supervision       General bed mobility comments: Supervision for safety, pt complies to her spinal precautions well throughout    Transfers Overall transfer level: Needs assistance Equipment used: Rolling walker (2 wheels) Transfers: Sit to/from Stand Sit to Stand: Contact guard assist           General transfer comment: Slow to power up to stand from low surfaces, but able to stand 1x from low EOB and 1x from commode without LOB, CGA for safety, good recall for hand placment    Ambulation/Gait Ambulation/Gait assistance: Contact guard assist Gait Distance (Feet): 240 Feet (+ 15') Assistive device: Rolling walker (2 wheels) Gait Pattern/deviations: Step-through pattern, Decreased stride length Gait velocity: reduced     General Gait Details: Pt takes slow, small, but fairly steady steps with a RW. No LOB, CGA for safety   Stairs Stairs: Yes Stairs assistance: Contact guard assist, Min assist Stair Management: No rails, Step to pattern, Forwards, Backwards, With walker Number of Stairs: 4 General stair comments: pt with great recall for sequencing of ascending and descending stairs forward with use of RW sideways placed on stairs for x1 UE support and x1 HHA on the other, x2 stairs in this manner, CGA-minA ascending and minA descending. Good recall for pt with ascending backward and descending forwards with RW facing forward on stairs for x2 UE support  on RW, minA to  hold RW in place, x2 steps in this manner reps with CGA for safety and assist to place RW   Wheelchair Mobility     Tilt Bed    Modified Rankin (Stroke Patients Only)       Balance Overall balance assessment: Mild deficits observed, not formally tested, Needs assistance Sitting-balance support: No upper extremity supported, Feet supported Sitting balance-Leahy Scale: Good     Standing balance support: Bilateral upper extremity supported, Reliant on assistive device for balance, During functional activity Standing balance-Leahy Scale: Poor Standing balance comment: reliant on UE support                            Communication Communication Communication: No apparent difficulties  Cognition Arousal: Alert Behavior During Therapy: WFL for tasks assessed/performed   PT - Cognitive impairments: No apparent impairments                         Following commands: Intact      Cueing Cueing Techniques: Verbal cues  Exercises      General Comments General comments (skin integrity, edema, etc.): Continued education on car transfers and recs to ambulate >/= x3 longer walks/day to tolerance upon d/c. She verbalized understanding. BP 114/72 (85) supine, 96/67 (77) sitting up EOB, 86/68 (75) standing, 109/68 (79) post ambulation      Pertinent Vitals/Pain Pain Assessment Pain Assessment: Faces Faces Pain Scale: Hurts little more Pain Location: back Pain Descriptors / Indicators: Operative site guarding    Home Living                          Prior Function            PT Goals (current goals can now be found in the care plan section) Acute Rehab PT Goals Patient Stated Goal: return home when appropriate PT Goal Formulation: With patient Time For Goal Achievement: 03/24/24 Progress towards PT goals: Progressing toward goals    Frequency    Min 5X/week      PT Plan      Co-evaluation              AM-PAC PT 6 Clicks  Mobility   Outcome Measure  Help needed turning from your back to your side while in a flat bed without using bedrails?: A Little Help needed moving from lying on your back to sitting on the side of a flat bed without using bedrails?: A Little Help needed moving to and from a bed to a chair (including a wheelchair)?: A Little Help needed standing up from a chair using your arms (e.g., wheelchair or bedside chair)?: A Little Help needed to walk in hospital room?: A Little Help needed climbing 3-5 steps with a railing? : A Little 6 Click Score: 18    End of Session Equipment Utilized During Treatment: Back brace Activity Tolerance: Patient tolerated treatment well Patient left: in chair;with call bell/phone within reach;with chair alarm set Nurse Communication: Mobility status;Other (comment) (BP) PT Visit Diagnosis: Other abnormalities of gait and mobility (R26.89);Pain;Unsteadiness on feet (R26.81);Difficulty in walking, not elsewhere classified (R26.2) Pain - part of body:  (back)     Time: 9165-9097 PT Time Calculation (min) (ACUTE ONLY): 28 min  Charges:    $Gait Training: 23-37 mins PT General Charges $$ ACUTE PT VISIT: 1 Visit  Cindy [name]. PTA Acute Rehabilitation Services Office: [phone]   Cindy [name] 03/12/2024, 9:06 AM

## 2024-03-12 NOTE — Progress Notes (Signed)
 Patient doing well, seems to be feeling better today. Ok to discharge home today. Discharge summary updated.

## 2024-03-12 NOTE — Plan of Care (Signed)
  Problem: Education: Goal: Knowledge of General Education information will improve Description: Including pain rating scale, medication(s)/side effects and non-pharmacologic comfort measures Outcome: Progressing   Problem: Health Behavior/Discharge Planning: Goal: Ability to manage health-related needs will improve Outcome: Progressing   Problem: Clinical Measurements: Goal: Ability to maintain clinical measurements within normal limits will improve Outcome: Progressing   Problem: Activity: Goal: Risk for activity intolerance will decrease Outcome: Progressing   Problem: Coping: Goal: Level of anxiety will decrease Outcome: Progressing   Problem: Pain Managment: Goal: General experience of comfort will improve and/or be controlled Outcome: Progressing   Problem: Safety: Goal: Ability to remain free from injury will improve Outcome: Progressing

## 2025-02-11 ENCOUNTER — Ambulatory Visit: Admitting: Neurology
